# Patient Record
Sex: Male | Born: 1960
Health system: Southern US, Community
[De-identification: ages and names within clinical notes are randomized; demographics above are authoritative.]

## PROBLEM LIST (undated history)

## (undated) DIAGNOSIS — I2510 Atherosclerotic heart disease of native coronary artery without angina pectoris: Secondary | ICD-10-CM

## (undated) DIAGNOSIS — G709 Myoneural disorder, unspecified: Secondary | ICD-10-CM

## (undated) DIAGNOSIS — I219 Acute myocardial infarction, unspecified: Secondary | ICD-10-CM

## (undated) DIAGNOSIS — I10 Essential (primary) hypertension: Secondary | ICD-10-CM

## (undated) DIAGNOSIS — Q159 Congenital malformation of eye, unspecified: Secondary | ICD-10-CM

## (undated) HISTORY — DX: Acute myocardial infarction, unspecified: I21.9

## (undated) HISTORY — DX: Atherosclerotic heart disease of native coronary artery without angina pectoris: I25.10

## (undated) HISTORY — DX: Myoneural disorder, unspecified: G70.9

## (undated) HISTORY — PX: APPENDECTOMY: SHX54

## (undated) HISTORY — PX: INGUINAL HERNIA REPAIR: SUR1180

## (undated) HISTORY — DX: Essential (primary) hypertension: I10

---

## 1988-07-19 HISTORY — PX: HERNIA REPAIR: SHX51

## 2001-07-18 ENCOUNTER — Ambulatory Visit (HOSPITAL_COMMUNITY): Admission: RE | Admit: 2001-07-18 | Discharge: 2001-07-19 | Payer: Self-pay | Admitting: Ophthalmology

## 2001-07-18 ENCOUNTER — Encounter: Payer: Self-pay | Admitting: Ophthalmology

## 2001-07-19 HISTORY — PX: SCLERAL BUCKLE PROCEDURE: SHX2734

## 2001-07-19 HISTORY — PX: EYE SURGERY: SHX253

## 2001-07-19 HISTORY — PX: RETINAL DETACHMENT SURGERY: SHX105

## 2006-02-15 ENCOUNTER — Ambulatory Visit: Payer: Self-pay | Admitting: Family Medicine

## 2009-02-10 ENCOUNTER — Ambulatory Visit: Payer: Self-pay | Admitting: Family Medicine

## 2009-02-18 ENCOUNTER — Ambulatory Visit: Payer: Self-pay | Admitting: Family Medicine

## 2009-04-03 ENCOUNTER — Ambulatory Visit: Payer: Self-pay | Admitting: Family Medicine

## 2009-04-11 ENCOUNTER — Ambulatory Visit: Payer: Self-pay | Admitting: Family Medicine

## 2010-12-04 NOTE — Op Note (Signed)
[place]  Patient:    Jonathan Lindsey, Jonathan Lindsey Visit Number: [id number] MRN: [id number]          Service Type: DSU Location: [phone] Attending Physician:  Bertrum Sol Dictated by:   Beulah Gandy. Ashley Royalty, M.D. Proc. Date: 07/18/01 Admit Date:  07/18/2001                             Operative Report  DATE OF BIRTH:  Mar 01, 1961  ADMISSION DIAGNOSIS:  Rhegmatogenous retinal detachment in the right eye.  PROCEDURE:  Scleral buckle - right eye, retinal photocoagulation - right eye.  SURGEON:  Beulah Gandy. Ashley Royalty, M.D.  ASSISTANT:  Alma Downs, P.A.  ANESTHESIA:  General.  DETAILS:  Usual prep and drape.  A 360 degree limbal peritomy, isolation of four rectus muscles on 2-0 silk.  Localization of breaks in lower temporal quadrant.  Scleral dissection from 3 oclock to 10 oclock to admit a #279 intrascleral implant.  One mm was trimmed from the posterior edge.  Diathermy placed in the bed, perforation site chosen at 4 oclock in the middle aspect of the bed.  The perforation was performed and a moderate amount of clear, thick, colorless subretinal fluid came forth very slowly.  Once this was completed, the 279 implant was placed against the scleral bed and the scleral flaps were closed with 4-0 Mersilene suture.  A 508G radial segment was placed beneath the perforation point at 4 oclock.  Indirect ophthalmoscopy showed the retina to be lying nicely on the scleral buckle.  The 240 band was placed around the eye with a belt loop at 11 and 2 and a 270 sleeve at 1 oclock. The buckle ends were trimmed, the suture ends were trimmed, and the band ends were trimmed.  The indirect ophthalmoscope laser was moved into place; 900 laser burns were placed with a power of 400 mW, 1000 microns each, and 0.1 second each on the scleral buckle, as well as around previously treated retinal breaks in the upper quadrants.  The conjunctiva was reposited  with 7-0 chromic suture. Polymyxin and gentamicin were irrigated into Tenons space.  A paracentesis x 1 was performed at 1 oclock.  Closing tension was 10 with the Barraquer tonometer.  Polymyxin and gentamicin were irrigated into Tenons space, atropine solution was applied.  Decadron 10 mg was injected into the lower subconjunctival space.  Marcaine was injected around the globe for postoperative pain.  Polysporin, a patch and shield were placed.  The patient awakened and taken to recovery in satisfactory condition.  DURATION:  1-1/2 hours.  COMPLICATIONS:  None.   Closing pressure 10 mmHg. Dictated by:   Beulah Gandy. Ashley Royalty, M.D. Attending Physician:  Bertrum Sol DD:  07/18/01 TD:  07/18/01 Job: [id number]

## 2013-07-19 DIAGNOSIS — I219 Acute myocardial infarction, unspecified: Secondary | ICD-10-CM

## 2013-07-19 HISTORY — DX: Acute myocardial infarction, unspecified: I21.9

## 2013-09-13 ENCOUNTER — Encounter: Payer: Self-pay | Admitting: Family Medicine

## 2013-09-14 ENCOUNTER — Encounter: Payer: Self-pay | Admitting: Family Medicine

## 2013-09-14 ENCOUNTER — Ambulatory Visit (INDEPENDENT_AMBULATORY_CARE_PROVIDER_SITE_OTHER): Payer: BC Managed Care – PPO | Admitting: Family Medicine

## 2013-09-14 VITALS — BP 110/82 | HR 68 | Ht 71.0 in | Wt 237.0 lb

## 2013-09-14 DIAGNOSIS — Z23 Encounter for immunization: Secondary | ICD-10-CM

## 2013-09-14 DIAGNOSIS — H5702 Anisocoria: Secondary | ICD-10-CM

## 2013-09-14 DIAGNOSIS — E669 Obesity, unspecified: Secondary | ICD-10-CM

## 2013-09-14 DIAGNOSIS — K4090 Unilateral inguinal hernia, without obstruction or gangrene, not specified as recurrent: Secondary | ICD-10-CM

## 2013-09-14 DIAGNOSIS — Z0000 Encounter for general adult medical examination without abnormal findings: Secondary | ICD-10-CM

## 2013-09-14 DIAGNOSIS — J309 Allergic rhinitis, unspecified: Secondary | ICD-10-CM

## 2013-09-14 DIAGNOSIS — L723 Sebaceous cyst: Secondary | ICD-10-CM

## 2013-09-14 DIAGNOSIS — L259 Unspecified contact dermatitis, unspecified cause: Secondary | ICD-10-CM

## 2013-09-14 DIAGNOSIS — L309 Dermatitis, unspecified: Secondary | ICD-10-CM

## 2013-09-14 DIAGNOSIS — Z125 Encounter for screening for malignant neoplasm of prostate: Secondary | ICD-10-CM

## 2013-09-14 LAB — POCT URINALYSIS DIPSTICK
Bilirubin, UA: NEGATIVE
Blood, UA: NEGATIVE
Glucose, UA: NEGATIVE
Ketones, UA: NEGATIVE
Leukocytes, UA: NEGATIVE
Nitrite, UA: NEGATIVE
Protein, UA: NEGATIVE
Spec Grav, UA: 1.02
Urobilinogen, UA: NEGATIVE
pH, UA: 5

## 2013-09-14 LAB — CBC WITH DIFFERENTIAL/PLATELET
Basophils Absolute: 0.1 10*3/uL (ref 0.0–0.1)
Basophils Relative: 1 % (ref 0–1)
Eosinophils Absolute: 0.2 10*3/uL (ref 0.0–0.7)
Eosinophils Relative: 3 % (ref 0–5)
HCT: 46 % (ref 39.0–52.0)
Hemoglobin: 16.4 g/dL (ref 13.0–17.0)
Lymphocytes Relative: 38 % (ref 12–46)
Lymphs Abs: 1.9 10*3/uL (ref 0.7–4.0)
MCH: 33.1 pg (ref 26.0–34.0)
MCHC: 35.7 g/dL (ref 30.0–36.0)
MCV: 92.7 fL (ref 78.0–100.0)
Monocytes Absolute: 0.4 10*3/uL (ref 0.1–1.0)
Monocytes Relative: 8 % (ref 3–12)
Neutro Abs: 2.6 10*3/uL (ref 1.7–7.7)
Neutrophils Relative %: 50 % (ref 43–77)
Platelets: 185 10*3/uL (ref 150–400)
RBC: 4.96 MIL/uL (ref 4.22–5.81)
RDW: 13.3 % (ref 11.5–15.5)
WBC: 5.1 10*3/uL (ref 4.0–10.5)

## 2013-09-14 LAB — COMPREHENSIVE METABOLIC PANEL
ALT: 41 U/L (ref 0–53)
AST: 28 U/L (ref 0–37)
Albumin: 4.3 g/dL (ref 3.5–5.2)
Alkaline Phosphatase: 60 U/L (ref 39–117)
BUN: 19 mg/dL (ref 6–23)
CO2: 26 mEq/L (ref 19–32)
Calcium: 9.2 mg/dL (ref 8.4–10.5)
Chloride: 105 mEq/L (ref 96–112)
Creat: 0.96 mg/dL (ref 0.50–1.35)
Glucose, Bld: 99 mg/dL (ref 70–99)
Potassium: 4 mEq/L (ref 3.5–5.3)
Sodium: 138 mEq/L (ref 135–145)
Total Bilirubin: 0.7 mg/dL (ref 0.2–1.2)
Total Protein: 6.7 g/dL (ref 6.0–8.3)

## 2013-09-14 LAB — LIPID PANEL
Cholesterol: 195 mg/dL (ref 0–200)
HDL: 31 mg/dL — ABNORMAL LOW (ref >39)
LDL Cholesterol: 128 mg/dL — ABNORMAL HIGH (ref 0–99)
Total CHOL/HDL Ratio: 6.3 Ratio
Triglycerides: 180 mg/dL — ABNORMAL HIGH (ref <150)
VLDL: 36 mg/dL (ref 0–40)

## 2013-09-14 NOTE — Addendum Note (Signed)
Addended by: Denita Lung on: 09/14/2013 01:40 PM   Modules accepted: Level of Service

## 2013-09-14 NOTE — Patient Instructions (Signed)
Use cortisone cream on the dry areas sparingly

## 2013-09-14 NOTE — Progress Notes (Signed)
Subjective:    Patient ID: Jonathan Lindsey, male    DOB: 01-11-1961, 53 y.o.   MRN: [id number]  HPI He is here for complete examination. He has not been seen in several years. He has had previous eye surgery in the left limbs was removed causing an change in pupillary size. He also complains of swelling in the left inguinal area. At this point it is giving him very little difficulty. He does have underlying allergies and has very low difficulty with them. He also complains of dryness to the medial aspect of both elbows and mainly has this during the winter months. He also has a lesion present on his forehead. This was removed several years ago and has recurred. Family and social history were reviewed. He does have a 86 year old brother who apparently has had difficulty with congestive heart failure. His marriage is going well. Work is going well.   Review of Systems  All other systems reviewed and are negative.       Objective:   Physical Exam BP 110/82  Pulse 68  Ht 5\' 11"  (1.803 m)  Wt 237 lb (107.502 kg)  BMI 33.07 kg/m2  General Appearance:    Alert, cooperative, no distress, appears stated age  Head:    Normocephalic, without obvious abnormality, atraumatic.2 cm round smooth movable lesions noted on the forehead   Eyes:    the left pupil is dilated more than the right, conjunctiva/corneas clear, EOM's intact, fundi    benign  Ears:    Normal TM's and external ear canals  Nose:   Nares normal, mucosa normal, no drainage or sinus   tenderness  Throat:   Lips, mucosa, and tongue normal; teeth and gums normal  Neck:   Supple, no lymphadenopathy;  thyroid:  no   enlargement/tenderness/nodules; no carotid   bruit or JVD  Back:    Spine nontender, no curvature, ROM normal, no CVA     tenderness  Lungs:     Clear to auscultation bilaterally without wheezes, rales or     ronchi; respirations unlabored  Chest Wall:    No tenderness or deformity   Heart:    Regular rate and rhythm,  S1 and S2 normal, no murmur, rub   or gallop  Breast Exam:    No chest wall tenderness, masses or gynecomastia  Abdomen:     Soft, non-tender, nondistended, normoactive bowel sounds,    no masses, no hepatosplenomegaly  Genitalia:    Normal male external genitalia without lesions.  Testicles without masses.  Left inguinal hernia is noted.  Rectal:   deferred   Extremities:   No clubbing, cyanosis or edema  Pulses:   2+ and symmetric all extremities  Skin:   Skin color, texture, turgor normal, too dryish patches are noted on the medial aspects of both elbows   Lymph nodes:   Cervical, supraclavicular, and axillary nodes normal  Neurologic:   CNII-XII intact, normal strength, sensation and gait; reflexes 2+ and symmetric throughout          Psych:   Normal mood, affect, hygiene and grooming.   EKG shows no acute changes       Assessment & Plan:  Routine general medical examination at a health care facility - Plan: Ambulatory referral to Gastroenterology, CBC with Differential, Comprehensive metabolic panel, Lipid panel, Tdap vaccine greater than or equal to 7yo IM, PSA, EKG 12-Lead, Urinalysis Dipstick  Anisocoria  Left inguinal hernia - Plan: Ambulatory referral to General Surgery  Allergic rhinitis due to allergen  Obesity (BMI 30-39.9)  Special screening for malignant neoplasm of prostate - Plan: PSA  Dermatitis  Sebaceous cyst  pupillary changes due to the surgery. He will be referred for Gen. surgery for the hernia. Recommend cortisone cream for his skin and return here for cyst excision at this convenience.

## 2013-09-15 LAB — PSA: PSA: 1.82 ng/mL (ref <=4.00)

## 2013-09-26 ENCOUNTER — Encounter: Payer: Self-pay | Admitting: Internal Medicine

## 2013-10-03 ENCOUNTER — Ambulatory Visit (INDEPENDENT_AMBULATORY_CARE_PROVIDER_SITE_OTHER): Payer: PRIVATE HEALTH INSURANCE | Admitting: Surgery

## 2013-10-03 ENCOUNTER — Encounter (INDEPENDENT_AMBULATORY_CARE_PROVIDER_SITE_OTHER): Payer: Self-pay | Admitting: Surgery

## 2013-10-03 VITALS — BP 146/90 | HR 84 | Temp 98.6°F | Resp 14 | Ht 71.0 in | Wt 239.4 lb

## 2013-10-03 DIAGNOSIS — K4090 Unilateral inguinal hernia, without obstruction or gangrene, not specified as recurrent: Secondary | ICD-10-CM | POA: Insufficient documentation

## 2013-10-03 NOTE — Patient Instructions (Signed)
[facility]  HERNIA REPAIR POST OP INSTRUCTIONS  Always review your discharge instruction sheet given to you by the facility where your surgery was performed.  1. A  prescription for pain medication may be given to you upon discharge.  Take your pain medication as prescribed.  If narcotic pain medicine is not needed, then you may take acetaminophen (Tylenol) or ibuprofen (Advil) as needed.  2. Take your usually prescribed medications unless otherwise directed.  3. If you need a refill on your pain medication, please contact your pharmacy.  They will contact our office to request authorization. Prescriptions will not be filled after 5 pm daily or on weekends.  4. You should follow a light diet the first 24 hours after arrival home, such as soup and crackers or toast.  Be sure to include plenty of fluids daily.  Resume your normal diet the day after surgery.  5. Most patients will experience some swelling and bruising around the surgical site.  Ice packs and reclining will help.  Swelling and bruising can take several days to resolve.   6. It is common to experience some constipation if taking pain medication after surgery.  Increasing fluid intake and taking a stool softener (such as Colace) will usually help or prevent this problem from occurring.  A mild laxative (Milk of Magnesia or Miralax) should be taken according to package directions if there are no bowel movements after 48 hours.  7. Unless discharge instructions indicate otherwise, you may remove your bandages 24-48 hours after surgery, and you may shower at that time.  You may have steri-strips (small skin tapes) in place directly over the incision.  These strips should be left on the skin for 7-10 days.  If your surgeon used skin glue on the incision, you may shower in 24 hours.  The glue will flake off over the next 2-3 weeks.  Any sutures or staples will be removed at the office during your follow-up  visit.  8. ACTIVITIES:  You may resume regular (light) daily activities beginning the next day-such as daily self-care, walking, climbing stairs-gradually increasing activities as tolerated.  You may have sexual intercourse when it is comfortable.  Refrain from any heavy lifting or straining until approved by your doctor.  You may drive when you are no longer taking prescription pain medication, you can comfortably wear a seatbelt, and you can safely maneuver your car and apply brakes.  9. You should see your doctor in the office for a follow-up appointment approximately 2-3 weeks after your surgery.  Make sure that you call for this appointment within a day or two after you arrive home to insure a convenient appointment time. 10.   WHEN TO CALL YOUR DOCTOR: 1. Fever greater than 101.0 2. Inability to urinate 3. Persistent nausea and/or vomiting 4. Extreme swelling or bruising 5. Continued bleeding from incision 6. Increased pain, redness, or drainage from the incision  The clinic staff is available to answer your questions during regular business hours.  Please don't hesitate to call and ask to speak to one of the nurses for clinical concerns.  If you have a medical emergency, go to the nearest emergency room or call 911.  A surgeon from [facility] is always on call for the hospital.   [facility] [address], [address], [place], [place]  [address]  [phone] ? [phone] ? FAX [phone]  [email or web address]   

## 2013-10-03 NOTE — Progress Notes (Signed)
General Surgery [facility]  Chief Complaint  Patient presents with  . New Evaluation    eval [facility] - referral from Dr. Jill Alexanders    HISTORY: Patient is a 53 year old male referred by his primary care physician for evaluation of left inguinal hernia. Patient states that the hernia has been present for approximately one year. He has gradually increased in size. It causes intermittent discomfort. He denies any signs or symptoms of intestinal obstruction. It has always been reducible.  Patient had undergone previous right inguinal hernia in 1990. This was repaired with mesh. Patient also has a distant history of appendectomy.  History reviewed. No pertinent past medical history.  No current outpatient prescriptions on file.   No current facility-administered medications for this visit.    Not on File  Family History  Problem Relation Age of Onset  . Diabetes Mother   . Hyperlipidemia Mother   . Hypertension Mother   . Cancer Father     MULTIPLE MELANOMAS    History   Social History  . Marital Status: Single    Spouse Name: N/A    Number of Children: N/A  . Years of Education: N/A   Social History Main Topics  . Smoking status: Never Smoker   . Smokeless tobacco: Never Used  . Alcohol Use: Yes     Comment: rare  . Drug Use: No  . Sexual Activity: Yes   Other Topics Concern  . None   Social History Narrative  . None    REVIEW OF SYSTEMS - PERTINENT POSITIVES ONLY: Denies signs or symptoms of obstruction. Intermittent discomfort. Always reducible.  EXAM: Filed Vitals:   10/03/13 1002  BP: 146/90  Pulse: 84  Temp: 98.6 F (37 C)  Resp: 14    GENERAL: well-developed, well-nourished, no acute distress HEENT: normocephalic; pupils equal and reactive; sclerae clear; dentition good; mucous membranes moist NECK:  No palpable masses in the thyroid bed symmetric on extension; no palpable anterior or posterior cervical lymphadenopathy; no  supraclavicular masses; no tenderness CHEST: clear to auscultation bilaterally without rales, rhonchi, or wheezes CARDIAC: regular rate and rhythm without significant murmur; peripheral pulses are full ABDOMEN: soft without distension; bowel sounds present; no mass; no hepatosplenomegaly; small umbilical hernia likely containing incarcerated omentum, nontender GU:  Normal male genitalia without mass or lesion; right groin with well-healed incision; palpation in the right inguinal canal with cough and Valsalva shows no sign of hernia; obvious bulge in left groin; palpation reveals a left inguinal hernia which is minimally tender and is reducible; left hernia augments with cough and Valsalva EXT:  non-tender without edema; no deformity NEURO: no gross focal deficits; no sign of tremor   LABORATORY RESULTS: See [facility] (CHL-Epic) for most recent results  RADIOLOGY RESULTS: See [facility] (CHL-Epic) for most recent results  IMPRESSION: #1 left inguinal hernia, reducible, mildly symptomatic #2 umbilical hernia, clinically stable, asymptomatic #3 history of right inguinal hernia status post repair without evidence of recurrence  PLAN: I discussed the above findings with the patient. I provided him with written literature to review at home. We discussed left inguinal hernia repair with mesh. We discussed the risk and benefits of the procedure including the possibility of recurrence. We discussed restrictions on his activities following the procedure. He understands and would like to proceed with surgery as an outpatient in April 2015. We will make arrangements for his procedure.  The risks and benefits of the procedure have been discussed at length with  the patient.  The patient understands the proposed procedure, potential alternative treatments, and the course of recovery to be expected.  All of the patient's questions have been answered at this time.  The patient wishes to proceed  with surgery.  Earnstine Regal, MD, [facility]  Primary Care Physician: Wyatt Haste, MD

## 2013-11-02 DIAGNOSIS — K4090 Unilateral inguinal hernia, without obstruction or gangrene, not specified as recurrent: Secondary | ICD-10-CM

## 2013-11-05 ENCOUNTER — Encounter (HOSPITAL_COMMUNITY): Payer: Self-pay | Admitting: Emergency Medicine

## 2013-11-05 ENCOUNTER — Encounter (HOSPITAL_COMMUNITY)
Admission: EM | Disposition: A | Discharge status: Home / Self Care | Payer: BC Managed Care – PPO | Source: Home / Self Care | Attending: Cardiology

## 2013-11-05 ENCOUNTER — Emergency Department (HOSPITAL_COMMUNITY): Payer: BC Managed Care – PPO

## 2013-11-05 ENCOUNTER — Inpatient Hospital Stay (HOSPITAL_COMMUNITY)
Admission: EM | Admit: 2013-11-05 | Discharge: 2013-11-08 | DRG: 247 | Disposition: A | Discharge status: Home / Self Care | Payer: BC Managed Care – PPO | Attending: Cardiology | Admitting: Cardiology

## 2013-11-05 DIAGNOSIS — F17200 Nicotine dependence, unspecified, uncomplicated: Secondary | ICD-10-CM | POA: Diagnosis present

## 2013-11-05 DIAGNOSIS — Z833 Family history of diabetes mellitus: Secondary | ICD-10-CM

## 2013-11-05 DIAGNOSIS — I2109 ST elevation (STEMI) myocardial infarction involving other coronary artery of anterior wall: Principal | ICD-10-CM | POA: Diagnosis present

## 2013-11-05 DIAGNOSIS — Z6831 Body mass index (BMI) 31.0-31.9, adult: Secondary | ICD-10-CM

## 2013-11-05 DIAGNOSIS — R7309 Other abnormal glucose: Secondary | ICD-10-CM | POA: Diagnosis present

## 2013-11-05 DIAGNOSIS — Z7982 Long term (current) use of aspirin: Secondary | ICD-10-CM

## 2013-11-05 DIAGNOSIS — E7800 Pure hypercholesterolemia, unspecified: Secondary | ICD-10-CM | POA: Diagnosis present

## 2013-11-05 DIAGNOSIS — I2510 Atherosclerotic heart disease of native coronary artery without angina pectoris: Secondary | ICD-10-CM | POA: Diagnosis present

## 2013-11-05 DIAGNOSIS — I213 ST elevation (STEMI) myocardial infarction of unspecified site: Secondary | ICD-10-CM

## 2013-11-05 DIAGNOSIS — Z9889 Other specified postprocedural states: Secondary | ICD-10-CM

## 2013-11-05 DIAGNOSIS — Z8249 Family history of ischemic heart disease and other diseases of the circulatory system: Secondary | ICD-10-CM

## 2013-11-05 DIAGNOSIS — I2582 Chronic total occlusion of coronary artery: Secondary | ICD-10-CM | POA: Diagnosis present

## 2013-11-05 HISTORY — PX: LEFT HEART CATHETERIZATION WITH CORONARY ANGIOGRAM: SHX5451

## 2013-11-05 LAB — BASIC METABOLIC PANEL
BUN: 13 mg/dL (ref 6–23)
CHLORIDE: 101 meq/L (ref 96–112)
CO2: 23 mEq/L (ref 19–32)
Calcium: 8.9 mg/dL (ref 8.4–10.5)
Creatinine, Ser: 0.93 mg/dL (ref 0.50–1.35)
GFR calc Af Amer: >90 mL/min (ref >90)
GFR calc non Af Amer: >90 mL/min (ref >90)
GLUCOSE: 144 mg/dL — AB (ref 70–99)
Potassium: 3.9 mEq/L (ref 3.7–5.3)
Sodium: 140 mEq/L (ref 137–147)

## 2013-11-05 LAB — CBC
HCT: 47.4 % (ref 39.0–52.0)
HEMOGLOBIN: 17.2 g/dL — AB (ref 13.0–17.0)
MCH: 33.5 pg (ref 26.0–34.0)
MCHC: 36.3 g/dL — AB (ref 30.0–36.0)
MCV: 92.2 fL (ref 78.0–100.0)
Platelets: 195 10*3/uL (ref 150–400)
RBC: 5.14 MIL/uL (ref 4.22–5.81)
RDW: 11.7 % (ref 11.5–15.5)
WBC: 8.6 10*3/uL (ref 4.0–10.5)

## 2013-11-05 LAB — I-STAT CHEM 8, ED
BUN: 12 mg/dL (ref 6–23)
CHLORIDE: 101 meq/L (ref 96–112)
Calcium, Ion: 1.07 mmol/L — ABNORMAL LOW (ref 1.12–1.23)
Creatinine, Ser: 1 mg/dL (ref 0.50–1.35)
GLUCOSE: 152 mg/dL — AB (ref 70–99)
HCT: 49 % (ref 39.0–52.0)
Hemoglobin: 16.7 g/dL (ref 13.0–17.0)
Potassium: 3.5 mEq/L — ABNORMAL LOW (ref 3.7–5.3)
Sodium: 139 mEq/L (ref 137–147)
TCO2: 24 mmol/L (ref 0–100)

## 2013-11-05 LAB — TROPONIN I: Troponin I: 1.62 ng/mL (ref <0.30)

## 2013-11-05 SURGERY — LEFT HEART CATHETERIZATION WITH CORONARY ANGIOGRAM
Anesthesia: LOCAL

## 2013-11-05 MED ORDER — DOCUSATE SODIUM 100 MG PO CAPS
100.0000 mg | ORAL_CAPSULE | Freq: Two times a day (BID) | ORAL | Status: DC
  Administered 2013-11-06 – 2013-11-08 (×6): 100 mg via ORAL
  Filled 2013-11-05 (×8): qty 1

## 2013-11-05 MED ORDER — SODIUM CHLORIDE 0.9 % IV SOLN
INTRAVENOUS | Status: AC
  Administered 2013-11-05 – 2013-11-06 (×2): via INTRAVENOUS

## 2013-11-05 MED ORDER — NITROGLYCERIN IN D5W 200-5 MCG/ML-% IV SOLN
5.0000 ug/min | INTRAVENOUS | Status: DC
  Administered 2013-11-05: 5 ug/min via INTRAVENOUS

## 2013-11-05 MED ORDER — TICAGRELOR 90 MG PO TABS
90.0000 mg | ORAL_TABLET | Freq: Two times a day (BID) | ORAL | Status: DC
  Administered 2013-11-06 – 2013-11-08 (×5): 90 mg via ORAL
  Filled 2013-11-05 (×7): qty 1

## 2013-11-05 MED ORDER — ATORVASTATIN CALCIUM 80 MG PO TABS
80.0000 mg | ORAL_TABLET | Freq: Every day | ORAL | Status: DC
  Administered 2013-11-06 – 2013-11-07 (×3): 80 mg via ORAL
  Filled 2013-11-05 (×4): qty 1

## 2013-11-05 MED ORDER — PANTOPRAZOLE SODIUM 40 MG PO TBEC
40.0000 mg | DELAYED_RELEASE_TABLET | Freq: Every day | ORAL | Status: DC
  Administered 2013-11-06 – 2013-11-08 (×3): 40 mg via ORAL
  Filled 2013-11-05 (×3): qty 1

## 2013-11-05 MED ORDER — ASPIRIN EC 81 MG PO TBEC: 81.0000 mg | DELAYED_RELEASE_TABLET | Freq: Every day | ORAL | Status: DC

## 2013-11-05 MED ORDER — HYDROCODONE-ACETAMINOPHEN 5-325 MG PO TABS
1.0000 | ORAL_TABLET | Freq: Four times a day (QID) | ORAL | Status: DC | PRN
  Administered 2013-11-06 (×2): 1 via ORAL
  Filled 2013-11-05: qty 2
  Filled 2013-11-05 (×2): qty 1

## 2013-11-05 MED ORDER — INSULIN ASPART 100 UNIT/ML ~~LOC~~ SOLN
0.0000 [IU] | Freq: Three times a day (TID) | SUBCUTANEOUS | Status: DC
  Administered 2013-11-06: 1 [IU] via SUBCUTANEOUS

## 2013-11-05 MED ORDER — ASPIRIN 81 MG PO CHEW
81.0000 mg | CHEWABLE_TABLET | Freq: Every day | ORAL | Status: DC
  Administered 2013-11-06 – 2013-11-08 (×3): 81 mg via ORAL
  Filled 2013-11-05 (×3): qty 1

## 2013-11-05 MED ORDER — ASPIRIN 81 MG PO CHEW: 324.0000 mg | CHEWABLE_TABLET | ORAL | Status: DC

## 2013-11-05 MED ORDER — HEPARIN SODIUM (PORCINE) 5000 UNIT/ML IJ SOLN
4000.0000 [IU] | Freq: Once | INTRAMUSCULAR | Status: AC
  Administered 2013-11-05: 4000 [IU] via INTRAVENOUS

## 2013-11-05 MED ORDER — ACETAMINOPHEN 325 MG PO TABS
650.0000 mg | ORAL_TABLET | Freq: Four times a day (QID) | ORAL | Status: DC | PRN
  Administered 2013-11-06 – 2013-11-07 (×2): 650 mg via ORAL
  Filled 2013-11-05 (×2): qty 2

## 2013-11-05 MED ORDER — MIDAZOLAM HCL 2 MG/2ML IJ SOLN
INTRAMUSCULAR | Status: AC
  Filled 2013-11-05: qty 2

## 2013-11-05 MED ORDER — RAMIPRIL 2.5 MG PO CAPS
2.5000 mg | ORAL_CAPSULE | Freq: Every day | ORAL | Status: DC
  Administered 2013-11-06 – 2013-11-08 (×3): 2.5 mg via ORAL
  Filled 2013-11-05 (×3): qty 1

## 2013-11-05 MED ORDER — TICAGRELOR 90 MG PO TABS: 90.0000 mg | ORAL_TABLET | Freq: Two times a day (BID) | ORAL | Status: DC

## 2013-11-05 MED ORDER — TICAGRELOR 90 MG PO TABS
180.0000 mg | ORAL_TABLET | Freq: Once | ORAL | Status: AC
  Administered 2013-11-05: 180 mg via ORAL
  Filled 2013-11-05: qty 2

## 2013-11-05 MED ORDER — NITROGLYCERIN 0.4 MG SL SUBL: 0.4000 mg | SUBLINGUAL_TABLET | SUBLINGUAL | Status: DC | PRN

## 2013-11-05 MED ORDER — HEPARIN (PORCINE) IN NACL 2-0.9 UNIT/ML-% IJ SOLN
INTRAMUSCULAR | Status: AC
  Filled 2013-11-05: qty 1000

## 2013-11-05 MED ORDER — CARVEDILOL 3.125 MG PO TABS
3.1250 mg | ORAL_TABLET | Freq: Two times a day (BID) | ORAL | Status: DC
  Administered 2013-11-06: 3.125 mg via ORAL
  Filled 2013-11-05 (×3): qty 1

## 2013-11-05 MED ORDER — FENTANYL CITRATE 0.05 MG/ML IJ SOLN
INTRAMUSCULAR | Status: AC
  Filled 2013-11-05: qty 2

## 2013-11-05 MED ORDER — LIDOCAINE HCL (PF) 1 % IJ SOLN
INTRAMUSCULAR | Status: AC
  Filled 2013-11-05: qty 30

## 2013-11-05 MED ORDER — ASPIRIN 300 MG RE SUPP
300.0000 mg | RECTAL | Status: DC
  Filled 2013-11-05: qty 1

## 2013-11-05 MED ORDER — NITROGLYCERIN IN D5W 200-5 MCG/ML-% IV SOLN
INTRAVENOUS | Status: AC
  Filled 2013-11-05: qty 250

## 2013-11-05 MED ORDER — NITROGLYCERIN 0.2 MG/ML ON CALL CATH LAB
INTRAVENOUS | Status: AC
  Filled 2013-11-05: qty 1

## 2013-11-05 MED ORDER — BIVALIRUDIN 250 MG IV SOLR
INTRAVENOUS | Status: AC
  Filled 2013-11-05: qty 250

## 2013-11-05 NOTE — ED Notes (Addendum)
Dr Essie Christine at bedside

## 2013-11-05 NOTE — ED Provider Notes (Signed)
CSN: [id number]     Arrival date & time 11/05/13  2034 History   First MD Initiated Contact with Patient 11/05/13 2101     Chief Complaint  Patient presents with  . Code STEMI     HPI Pt reports worsening intermittent CP through the weekend since inguinal hernia surgery 3 days ago. No hx of heart disease. No family hx of heart disease. Pain this evening worsened and moved down his left arm with SOB, diaphoresis and became pale per family. No abdominal pain. Asa and nitro prior to arrival. Nitro resulted in some relief in his pain. Pain is mild at this time   History reviewed. No pertinent past medical history. Past Surgical History  Procedure Laterality Date  . Hernia repair  1990    NOT SURE ??rih WITH MESH  . Appendectomy    . Eye surgery  2003    EYE DETACHMENT RETINA AND BUCKLES   Family History  Problem Relation Age of Onset  . Diabetes Mother   . Hyperlipidemia Mother   . Hypertension Mother   . Cancer Father     MULTIPLE MELANOMAS   History  Substance Use Topics  . Smoking status: Never Smoker   . Smokeless tobacco: Never Used  . Alcohol Use: Yes     Comment: rare    Review of Systems  All other systems reviewed and are negative.     Allergies  Review of patient's allergies indicates no known allergies.  Home Medications   Prior to Admission medications   Not on File   BP 139/95  Pulse 97  Temp(Src) 98.7 F (37.1 C) (Oral)  Resp 12  Ht 5\' 11"  (1.803 m)  Wt 226 lb (102.513 kg)  BMI 31.53 kg/m2  SpO2 94% Physical Exam  Nursing note and vitals reviewed. Constitutional: He is oriented to person, place, and time. He appears well-developed and well-nourished.  HENT:  Head: Normocephalic and atraumatic.  Eyes: EOM are normal.  Neck: Normal range of motion.  Cardiovascular: Normal rate, regular rhythm, normal heart sounds and intact distal pulses.   Pulmonary/Chest: Effort normal and breath sounds normal. No respiratory distress.  Abdominal: Soft.  He exhibits no distension. There is no tenderness.  Musculoskeletal: Normal range of motion.  Neurological: He is alert and oriented to person, place, and time.  Skin: Skin is warm and dry.  Psychiatric: He has a normal mood and affect. Judgment normal.    ED Course  Procedures (including critical care time) CRITICAL CARE Performed by: Hoy Morn Total critical care time: 30 Critical care time was exclusive of separately billable procedures and treating other patients. Critical care was necessary to treat or prevent imminent or life-threatening deterioration. Critical care was time spent personally by me on the following activities: development of treatment plan with patient and/or surrogate as well as nursing, discussions with consultants, evaluation of patient's response to treatment, examination of patient, obtaining history from patient or surrogate, ordering and performing treatments and interventions, ordering and review of laboratory studies, ordering and review of radiographic studies, pulse oximetry and re-evaluation of patient's condition.   Labs Review Labs Reviewed  I-STAT CHEM 8, ED - Abnormal; Notable for the following:    Potassium 3.5 (*)    Glucose, Bld 152 (*)    Calcium, Ion 1.07 (*)    All other components within normal limits  CBC  BASIC METABOLIC PANEL  TROPONIN I  I-STAT TROPOININ, ED    Imaging Review No results found.  ECG  interpretation 2045  Date: 11/05/2013  Rate: 77  Rhythm: normal sinus rhythm  QRS Axis: normal  Intervals: normal  ST/T Wave abnormalities: concerning for ST elevation in anterior leads  Conduction Disutrbances: none  Narrative Interpretation:   Old EKG Reviewed: changed from prior ecg, concerning for STEMI     MDM   Final diagnoses:  STEMI (ST elevation myocardial infarction)    Concerning for recent intraoperative plaque rupture now with ACS and ST elevation on ECG as compared to prior ecg. ASA and nitro given PTA.  Heparin bolus in ER. Spoke with Dr Terrence Dupont, STEMI MD who requests brilinta as well. Still with CP at this time. zoll monitor. Will await cath team     Hoy Morn, MD 11/05/13 2109

## 2013-11-05 NOTE — ED Notes (Signed)
Pt arrives via EMS. Hernia repair sx on Friday. Friday afternoon pt started experiencing substernal discomfort. Pt then started taking pain medication, hydrocodone, with relief. Pain seems to present after eating, and then pt would take hydrocodone. Pt states that tonight the pain did not get better with pain medication and it is the worst it has been. Initial 12 lead, V3 elevation. After another EKG lead 3 and V2 now showing elevation.  BP 154/102 HR 97 94% 2L. 4 ASA, 2 NTG with no relief of chest pain. Pt also c/o nausea.

## 2013-11-05 NOTE — H&P (Signed)
Jonathan Lindsey is an 53 y.o. male.   Chief Complaint: Recurrent chest pain HPI: Patient is 53 year old male with no significant past medical history came to the ER by EMS complaining of recurrent retrosternal chest pain described as pressure heaviness as if someone is standing on his chest off and on since this left internal hernia surgery for last 3 days. Today around 6:30 PM pain got worst after eating supper grade 10 over 10 radiating to left arm associated with mild shortness of breath diaphoresis and pale appearance noted by family. Patient denies such episodes of chest pain in the past EKG done in the ER showed normal sinus rhythm with the minimal ST elevation in lead V1 to V3 and very minimal reciprocal changes in lead 1 and aVL suggestive of acute anteroseptal injury.Code STEMI was called and patient was taken emergently to Cath Lab  History reviewed. No pertinent past medical history.  Past Surgical History  Procedure Laterality Date  . Hernia repair  1990    NOT SURE ??rih WITH MESH  . Appendectomy    . Eye surgery  2003    EYE DETACHMENT RETINA AND BUCKLES    Family History  Problem Relation Age of Onset  . Diabetes Mother   . Hyperlipidemia Mother   . Hypertension Mother   . Cancer Father     MULTIPLE MELANOMAS   Social History:  reports that he has never smoked. He has never used smokeless tobacco. He reports that he drinks alcohol. He reports that he does not use illicit drugs.  Allergies: No Known Allergies  Medications Prior to Admission  Medication Sig Dispense Refill  . aspirin 81 MG tablet Take 324 mg by mouth daily.      Marland Kitchen docusate sodium (COLACE) 100 MG capsule Take 100 mg by mouth 2 (two) times daily.      Marland Kitchen HYDROcodone-acetaminophen (NORCO/VICODIN) 5-325 MG per tablet Take 1 tablet by mouth every 6 (six) hours as needed for moderate pain.      . Multiple Vitamins-Minerals (EYE VITAMINS PO) Take 1 tablet by mouth daily.      . Omega-3 Fatty Acids (FISH OIL)  1000 MG CAPS Take 1 capsule by mouth daily.        Results for orders placed during the hospital encounter of 11/05/13 (from the past 48 hour(s))  CBC     Status: Abnormal   Collection Time    11/05/13  8:43 PM      Result Value Ref Range   WBC 8.6  4.0 - 10.5 K/uL   RBC 5.14  4.22 - 5.81 MIL/uL   Hemoglobin 17.2 (*) 13.0 - 17.0 g/dL   HCT 47.4  39.0 - 52.0 %   MCV 92.2  78.0 - 100.0 fL   MCH 33.5  26.0 - 34.0 pg   MCHC 36.3 (*) 30.0 - 36.0 g/dL   RDW 11.7  11.5 - 15.5 %   Platelets 195  150 - 400 K/uL  BASIC METABOLIC PANEL     Status: Abnormal   Collection Time    11/05/13  8:43 PM      Result Value Ref Range   Sodium 140  137 - 147 mEq/L   Potassium 3.9  3.7 - 5.3 mEq/L   Chloride 101  96 - 112 mEq/L   CO2 23  19 - 32 mEq/L   Glucose, Bld 144 (*) 70 - 99 mg/dL   BUN 13  6 - 23 mg/dL   Creatinine, Ser 0.93  0.50 - 1.35 mg/dL   Calcium 8.9  8.4 - 10.5 mg/dL   GFR calc non Af Amer >90  >90 mL/min   GFR calc Af Amer >90  >90 mL/min   Comment: (NOTE)     The eGFR has been calculated using the CKD EPI equation.     This calculation has not been validated in all clinical situations.     eGFR's persistently <90 mL/min signify possible Chronic Kidney     Disease.  TROPONIN I     Status: Abnormal   Collection Time    11/05/13  8:57 PM      Result Value Ref Range   Troponin I 1.62 (*) <0.30 ng/mL   Comment:            Due to the release kinetics of cTnI,     a negative result within the first hours     of the onset of symptoms does not rule out     myocardial infarction with certainty.     If myocardial infarction is still suspected,     repeat the test at appropriate intervals.     CRITICAL RESULT CALLED TO, READ BACK BY AND VERIFIED WITHUnknown Jim RN [phone] 2152 GREEN R  I-STAT CHEM 8, ED     Status: Abnormal   Collection Time    11/05/13  9:02 PM      Result Value Ref Range   Sodium 139  137 - 147 mEq/L   Potassium 3.5 (*) 3.7 - 5.3 mEq/L   Chloride 101  96 -  112 mEq/L   BUN 12  6 - 23 mg/dL   Creatinine, Ser 1.00  0.50 - 1.35 mg/dL   Glucose, Bld 152 (*) 70 - 99 mg/dL   Calcium, Ion 1.07 (*) 1.12 - 1.23 mmol/L   TCO2 24  0 - 100 mmol/L   Hemoglobin 16.7  13.0 - 17.0 g/dL   HCT 49.0  39.0 - 52.0 %   Dg Chest Port 1 View  11/05/2013   CLINICAL DATA:  Chest pain.  Decreased heart rate.  Sweats.  EXAM: PORTABLE CHEST - 1 VIEW  COMPARISON:  None.  FINDINGS: Shallow inspiration. The heart size and mediastinal contours are within normal limits. Both lungs are clear. The visualized skeletal structures are unremarkable.  IMPRESSION: No active disease.   Electronically Signed   By: Lucienne Capers M.D.   On: 11/05/2013 21:28    Review of Systems  Constitutional: Negative for chills.  HENT: Negative for hearing loss and tinnitus.   Eyes: Negative for double vision, photophobia and pain.  Respiratory: Positive for shortness of breath. Negative for cough, hemoptysis and sputum production.   Cardiovascular: Negative for palpitations, orthopnea, claudication and leg swelling.  Gastrointestinal: Positive for nausea. Negative for vomiting and abdominal pain.  Genitourinary: Negative for dysuria.  Neurological: Negative for dizziness and headaches.    Blood pressure 145/97, pulse 82, temperature 98.7 F (37.1 C), temperature source Oral, resp. rate 10, height '5\' 11"'  (1.803 m), weight 102.513 kg (226 lb), SpO2 96.00%. Physical Exam  Constitutional: He is oriented to person, place, and time.  HENT:  Head: Atraumatic.  Eyes: Conjunctivae are normal. Pupils are equal, round, and reactive to light. Left eye exhibits no discharge. No scleral icterus.  Neck: Normal range of motion. Neck supple. No JVD present. No tracheal deviation present. No thyromegaly present.  Cardiovascular: Normal rate and regular rhythm.   Murmur (soft systolic murmur and S4  gallop noted) heard. Respiratory: Effort normal and breath sounds normal. No respiratory distress. He has no  wheezes. He has no rales.  GI: Soft. Bowel sounds are normal. He exhibits no distension. There is no tenderness. There is no rebound and no guarding.  Left internal surgical area dry  Musculoskeletal: He exhibits no edema and no tenderness.  Neurological: He is alert and oriented to person, place, and time.     Assessment/Plan Acute anteroseptal wall myocardial infarction Elevated blood sugar rule out diabetes mellitus Status post recent left inguinal hernia repair   plan As per orders  Discussed with patient and his wife briefly regarding emergency left cath possible PTCA stenting its risk and benefits i.e. death MI stroke need for emergency CABG local basilar complications etc. and consented for PCI  Clent Demark 11/05/2013, 10:26 PM

## 2013-11-05 NOTE — CV Procedure (Signed)
Left cardiac cath/PTCA stenting report dictated on 11/05/2013 dictation number is [id number]

## 2013-11-05 NOTE — ED Notes (Signed)
Dr. Campos at bedside   

## 2013-11-06 ENCOUNTER — Telehealth (INDEPENDENT_AMBULATORY_CARE_PROVIDER_SITE_OTHER): Payer: Self-pay

## 2013-11-06 LAB — COMPREHENSIVE METABOLIC PANEL
ALBUMIN: 3.2 g/dL — AB (ref 3.5–5.2)
ALT: 39 U/L (ref 0–53)
AST: 84 U/L — AB (ref 0–37)
Alkaline Phosphatase: 67 U/L (ref 39–117)
BUN: 13 mg/dL (ref 6–23)
CALCIUM: 9.4 mg/dL (ref 8.4–10.5)
CO2: 24 meq/L (ref 19–32)
CREATININE: 0.88 mg/dL (ref 0.50–1.35)
Chloride: 102 mEq/L (ref 96–112)
GFR calc Af Amer: >90 mL/min (ref >90)
Glucose, Bld: 132 mg/dL — ABNORMAL HIGH (ref 70–99)
Potassium: 4.1 mEq/L (ref 3.7–5.3)
Sodium: 139 mEq/L (ref 137–147)
Total Bilirubin: 0.4 mg/dL (ref 0.3–1.2)
Total Protein: 6.4 g/dL (ref 6.0–8.3)

## 2013-11-06 LAB — CBC
HCT: 44.9 % (ref 39.0–52.0)
Hemoglobin: 16.3 g/dL (ref 13.0–17.0)
MCH: 33.5 pg (ref 26.0–34.0)
MCHC: 36.3 g/dL — AB (ref 30.0–36.0)
MCV: 92.2 fL (ref 78.0–100.0)
PLATELETS: 172 10*3/uL (ref 150–400)
RBC: 4.87 MIL/uL (ref 4.22–5.81)
RDW: 11.8 % (ref 11.5–15.5)
WBC: 7.7 10*3/uL (ref 4.0–10.5)

## 2013-11-06 LAB — CBC WITH DIFFERENTIAL/PLATELET
BASOS ABS: 0 10*3/uL (ref 0.0–0.1)
Basophils Relative: 0 % (ref 0–1)
EOS ABS: 0.1 10*3/uL (ref 0.0–0.7)
EOS PCT: 2 % (ref 0–5)
HCT: 44.2 % (ref 39.0–52.0)
Hemoglobin: 16.1 g/dL (ref 13.0–17.0)
Lymphocytes Relative: 25 % (ref 12–46)
Lymphs Abs: 2 10*3/uL (ref 0.7–4.0)
MCH: 33.7 pg (ref 26.0–34.0)
MCHC: 36.4 g/dL — ABNORMAL HIGH (ref 30.0–36.0)
MCV: 92.5 fL (ref 78.0–100.0)
Monocytes Absolute: 0.5 10*3/uL (ref 0.1–1.0)
Monocytes Relative: 6 % (ref 3–12)
Neutro Abs: 5.3 10*3/uL (ref 1.7–7.7)
Neutrophils Relative %: 67 % (ref 43–77)
Platelets: 168 10*3/uL (ref 150–400)
RBC: 4.78 MIL/uL (ref 4.22–5.81)
RDW: 11.7 % (ref 11.5–15.5)
WBC: 8 10*3/uL (ref 4.0–10.5)

## 2013-11-06 LAB — GLUCOSE, CAPILLARY
GLUCOSE-CAPILLARY: 122 mg/dL — AB (ref 70–99)
Glucose-Capillary: 140 mg/dL — ABNORMAL HIGH (ref 70–99)
Glucose-Capillary: 133 mg/dL — ABNORMAL HIGH (ref 70–99)

## 2013-11-06 LAB — APTT: aPTT: 56 seconds — ABNORMAL HIGH (ref 24–37)

## 2013-11-06 LAB — BASIC METABOLIC PANEL
BUN: 13 mg/dL (ref 6–23)
CALCIUM: 9.7 mg/dL (ref 8.4–10.5)
CO2: 22 meq/L (ref 19–32)
Chloride: 100 mEq/L (ref 96–112)
Creatinine, Ser: 0.86 mg/dL (ref 0.50–1.35)
GFR calc Af Amer: >90 mL/min (ref >90)
Glucose, Bld: 139 mg/dL — ABNORMAL HIGH (ref 70–99)
Potassium: 4.2 mEq/L (ref 3.7–5.3)
SODIUM: 138 meq/L (ref 137–147)

## 2013-11-06 LAB — LIPID PANEL
CHOLESTEROL: 193 mg/dL (ref 0–200)
HDL: 30 mg/dL — ABNORMAL LOW (ref >39)
LDL Cholesterol: 123 mg/dL — ABNORMAL HIGH (ref 0–99)
TRIGLYCERIDES: 202 mg/dL — AB (ref <150)
Total CHOL/HDL Ratio: 6.4 RATIO
VLDL: 40 mg/dL (ref 0–40)

## 2013-11-06 LAB — TROPONIN I
TROPONIN I: 18.67 ng/mL — AB (ref <0.30)
Troponin I: 18.65 ng/mL

## 2013-11-06 LAB — MRSA PCR SCREENING: MRSA by PCR: NEGATIVE

## 2013-11-06 LAB — PROTIME-INR
INR: 1.22 (ref 0.00–1.49)
PROTHROMBIN TIME: 15.1 s (ref 11.6–15.2)

## 2013-11-06 LAB — HEMOGLOBIN A1C
Hgb A1c MFr Bld: 5.3 % (ref <5.7)
Mean Plasma Glucose: 105 mg/dL (ref <117)

## 2013-11-06 LAB — MAGNESIUM: MAGNESIUM: 2.2 mg/dL (ref 1.5–2.5)

## 2013-11-06 LAB — POCT ACTIVATED CLOTTING TIME
Activated Clotting Time: 382 s
Activated Clotting Time: 138 seconds

## 2013-11-06 LAB — TSH: TSH: 0.598 u[IU]/mL (ref 0.350–4.500)

## 2013-11-06 MED ORDER — MORPHINE SULFATE 2 MG/ML IJ SOLN
2.0000 mg | INTRAMUSCULAR | Status: DC | PRN
  Administered 2013-11-06: 2 mg via INTRAVENOUS
  Filled 2013-11-06: qty 1

## 2013-11-06 MED ORDER — POTASSIUM CHLORIDE CRYS ER 20 MEQ PO TBCR
40.0000 meq | EXTENDED_RELEASE_TABLET | Freq: Once | ORAL | Status: AC
  Administered 2013-11-06: 40 meq via ORAL
  Filled 2013-11-06: qty 2

## 2013-11-06 MED ORDER — ATROPINE SULFATE 0.1 MG/ML IJ SOLN
INTRAMUSCULAR | Status: AC
  Filled 2013-11-06: qty 10

## 2013-11-06 MED ORDER — PNEUMOCOCCAL VAC POLYVALENT 25 MCG/0.5ML IJ INJ
0.5000 mL | INJECTION | INTRAMUSCULAR | Status: AC
  Administered 2013-11-08: 0.5 mL via INTRAMUSCULAR
  Filled 2013-11-06: qty 0.5

## 2013-11-06 MED ORDER — CARVEDILOL 6.25 MG PO TABS
6.2500 mg | ORAL_TABLET | Freq: Two times a day (BID) | ORAL | Status: DC
  Administered 2013-11-06 – 2013-11-08 (×4): 6.25 mg via ORAL
  Filled 2013-11-06 (×6): qty 1

## 2013-11-06 MED ORDER — MAGNESIUM HYDROXIDE 400 MG/5ML PO SUSP
30.0000 mL | Freq: Four times a day (QID) | ORAL | Status: DC | PRN
  Administered 2013-11-06: 30 mL via ORAL
  Filled 2013-11-06: qty 30

## 2013-11-06 MED FILL — Sodium Chloride IV Soln 0.9%: INTRAVENOUS | Qty: 50 | Status: AC

## 2013-11-06 NOTE — Progress Notes (Signed)
General Surgery [facility]  I was notified of patient's admission 4 days after out-patient [facility] repair with mesh.  I visited patient and discussed his symptoms and subsequent hospitalization.  He appears to be doing well following his stent placement.  Hernia wound is healing uneventfully.  Will see in office 2-3 weeks in follow-up.  Earnstine Regal, MD, [facility] Office: [phone]

## 2013-11-06 NOTE — Progress Notes (Signed)
CRITICAL VALUE ALERT  Critical value received: Troponin 18.67   Date of notification:  11/06/13  Time of notification:  0102  Critical value read back:yes  Nurse who received alert:  [place] Severo Beber RN  MD notified (1st page):  Dr. Terrence Dupont  Time of first page:  0108  Responding MD:  Dr. Terrence Dupont  Time MD responded: [phone]

## 2013-11-06 NOTE — Progress Notes (Signed)
CARDIAC REHAB PHASE I   PRE:  Rate/Rhythm: 43 SR with PVC    BP: sitting 150/83    SaO2:   MODE:  Ambulation: 350 ft   POST:  Rate/Rhythm: 94 SR    BP: sitting 150/92     SaO2:   Pt limping some due to hernia repair and groin. Otherwise tolerated well, no c/o. BP elevated. Return to recliner. Began ed re MI/stent and gave diet sheets. Visitors came. Will f/u in am. Can walk with wife.  [place], ACSM 11/06/2013 11:41 AM

## 2013-11-06 NOTE — Progress Notes (Signed)
Subjective:   Patient denies any chest pain or shortness of breath. Patient tolerated PCI 200% occluded LAD with excellent results.  distal LAD wraps around the apex Objective:  Vital Signs in the last 24 hours: Temp:  [98.6 F (37 C)-98.7 F (37.1 C)] 98.6 F (37 C) (04/21 0700) Pulse Rate:  [67-97] 82 (04/21 0800) Resp:  [9-24] 24 (04/21 0800) BP: (126-160)/(76-101) 148/82 mmHg (04/21 0800) SpO2:  [93 %-97 %] 96 % (04/21 0800) Weight:  [102.513 kg (226 lb)-104 kg (229 lb 4.5 oz)] 104 kg (229 lb 4.5 oz) (04/20 2330)  Intake/Output from previous day: 04/20 0701 - 04/21 0700 In: 1182.5 [P.O.:60; I.V.:1122.5] Out: -  Intake/Output from this shift: Total I/O In: 128 [I.V.:128] Out: -   Physical Exam: Neck: no adenopathy, no carotid bruit, no JVD and supple, symmetrical, trachea midline Lungs: clear to auscultation bilaterally Heart: regular rate and rhythm, S1, S2 normal, no murmur, click, rub or gallop Abdomen: soft, non-tender; bowel sounds normal; no masses,  no organomegaly Extremities: extremities normal, atraumatic, no cyanosis or edema and Right groin stable left groin surgical incision healing well  Lab Results:  Recent Labs  11/05/13 2330 11/06/13 1000  WBC 8.0 7.7  HGB 16.1 16.3  PLT 168 172    Recent Labs  11/05/13 2043 11/05/13 2102 11/05/13 2330  NA 140 139 139  K 3.9 3.5* 4.1  CL 101 101 102  CO2 23  --  24  GLUCOSE 144* 152* 132*  BUN 13 12 13   CREATININE 0.93 1.00 0.88    Recent Labs  11/05/13 2057 11/05/13 2305  TROPONINI 1.62* 18.67*   Hepatic Function Panel  Recent Labs  11/05/13 2330  PROT 6.4  ALBUMIN 3.2*  AST 84*  ALT 39  ALKPHOS 67  BILITOT 0.4   No results found for this basename: CHOL,  in the last 72 hours No results found for this basename: PROTIME,  in the last 72 hours  Imaging: Imaging results have been reviewed and Dg Chest Port 1 View  11/05/2013   CLINICAL DATA:  Chest pain.  Decreased heart rate.  Sweats.   EXAM: PORTABLE CHEST - 1 VIEW  COMPARISON:  None.  FINDINGS: Shallow inspiration. The heart size and mediastinal contours are within normal limits. Both lungs are clear. The visualized skeletal structures are unremarkable.  IMPRESSION: No active disease.   Electronically Signed   By: Lucienne Capers M.D.   On: 11/05/2013 21:28    Cardiac Studies:  Assessment/Plan:  Acute anteroseptal/apical wall myocardial infarction  Elevated blood sugar rule out diabetes mellitus  Status post recent left inguinal hernia repair  Tobacco use plan As per orders   LOS: 1 day    Clent Demark 11/06/2013, 10:31 AM

## 2013-11-06 NOTE — Care Management Note (Signed)
    Page 1 of 1   11/06/2013     9:58:49 AM CARE MANAGEMENT NOTE 11/06/2013  Patient:  Jonathan Lindsey, Jonathan Lindsey   Account Number:  [id number]  Date Initiated:  11/06/2013  Documentation initiated by:  Elissa Hefty  Subjective/Objective Assessment:   adm w mi     Action/Plan:   lives w fam, pcp dr Jill Alexanders   Anticipated DC Date:     Anticipated DC Plan:  [place]  CM consult  Medication Assistance      Choice offered to / List presented to:             Status of service:   Medicare Important Message given?   (If response is "NO", the following Medicare IM given date fields will be blank) Date Medicare IM given:   Date Additional Medicare IM given:    Discharge Disposition:  HOME/SELF CARE  Per UR Regulation:  Reviewed for med. necessity/level of care/duration of stay  If discussed at [place] of Stay Meetings, dates discussed:    Comments:  4/21 0957 debbie Braiden Presutti rn,bsn spoke w pt and wife. he has bcbs ins. he has brilinta 30dy free and copay assist card. his da checked w pharm and his copay w card will be 18.00 per month.

## 2013-11-06 NOTE — Progress Notes (Signed)
Cardiac Cath Sheath Removal Note  Site Area: Right Femoral Artery Site Prior to Removal: Level 0 Pressure Applied For: 27 minutes beginning at 0128 Patient Status during Sheath Pull: VS stable, pt had no complaints of pain, PRN morphine given pre sheath removal  Post Sheath Removal Groin Site: Level 0, soft upon palpation Post Cath Instructions Give? Yes Post Pulses Present? Yes Pressure Dressing Applied? Yes  Therisa Doyne RN

## 2013-11-06 NOTE — Progress Notes (Signed)
Paged to ED room 33 for code stemi. Went to room to meet patient and family. Escorted son to ED waiting room to get family friends to take to cath waiting area while patient was being taken to cath lab. Checked on family while patient was having cath done. After cath checked on patient in his room where his wife and son was in the room with him,

## 2013-11-06 NOTE — Cardiovascular Report (Signed)
NAMEMarland Kitchen  ANTWAINE, BOOMHOWER NO.:  [id number]  MEDICAL RECORD NO.:  [id number]  LOCATION:  2H12C                        FACILITY:  [facility]  PHYSICIAN:  Jaymie Mckiddy N. Terrence Dupont, M.D. DATE OF BIRTH:  1960-10-28  DATE OF PROCEDURE:  11/05/2013 DATE OF DISCHARGE:                           CARDIAC CATHETERIZATION   PROCEDURES: 1. Left cardiac catheterization with selective left and right coronary     angiography, left ventriculography via right groin using Judkins     technique. 2. Successful percutaneous transluminal coronary angioplasty to     proximal 100% occluded left anterior descending coronary artery     using 2.5 x 12 mm long Trek balloon. 3. Successful deployment of 3.5 x 23 mm long Xience Alpine drug-     eluting stent in proximal left anterior descending coronary artery. 4. Successful postdilatation of the stent using 3.5 x 15 mm long      Emerge balloon.  INDICATION FOR THE PROCEDURE:  Mr. Recupero is a 53 year old male with no significant past medical history.  He came to the ER by EMS, complaining of recurrent retrosternal chest pain described as pressure, heaviness as if someone is standing on his chest off and on since his left inguinal surgery for last 3 days.  Today around 6:30 p.m., pain got worse after eating supper, grade 10/10, radiating to the left arm associated with mild shortness of breath, diaphoresis, and pale appearance noted by the family.  The patient denies such episodes of chest pain in the past.  EKG done in the ER showed normal sinus rhythm with minimal ST elevation in lead I to V3 and very minimal reciprocal changes in lead I and aVL, suggestive of acute anteroseptal wall injury. Code STEMI was called, and the patient was taken emergently to the cath lab.  I discussed with the patient and his wife briefly regarding emergency cath, possible PTCA stenting, its risks and benefits, i.e., death, MI, stroke, need for emergency CABG, local  vascular complications, etc., and consented for PCI.  DESCRIPTION OF PROCEDURE:  After obtaining the informed consent, the patient was brought to the cath lab and was placed on fluoroscopy table. Right groin was prepped and draped in usual fashion.  Xylocaine 1% was used for local anesthesia in the right groin.  With the help of thin wall needle, 6-French arterial sheath was placed.  The sheath was aspirated and flushed.  Next, 6-French left Judkins catheter was advanced over the wire under fluoroscopic guidance up to the ascending aorta.  Wire was pulled out.  The catheter was aspirated and connected to the Manifold.  Catheter was further advanced and engaged into left coronary ostium.  Multiple views of the left system were taken.  Next, catheter was disengaged and was pulled out over the wire and was replaced with 6-French right Judkins catheter which was advanced over the wire under fluoroscopic guidance up to the ascending aorta.  Wire was pulled out.  The catheter was aspirated and connected to the Manifold.  Catheter was further advanced and engaged into right coronary ostium.  A single view of right coronary artery was obtained.  Next, catheter was disengaged and was pulled out over the wire and was replaced with  6-French pigtail catheter at the end of the procedure which was advanced over the wire under fluoroscopic guidance up to the ascending aorta.  Wire was pulled out.  The catheter was aspirated and connected to the Manifold.  Catheter was further advanced across the aortic valve into the LV.  LV pressures were recorded.  Next, LV graft was done in 30-degree RAO position.  Post-angiographic pressures were recorded from LV and then pullback pressures were recorded from the aorta.  There was no gradient across the aortic valve.  Next, the pigtail catheter was pulled out over the wire.  Sheaths were aspirated and flushed.  FINDINGS:  LV showed inferoapical wall dyskinesia,  EF of 45% to 50%. Left main was patent.  LAD was 100% occluded after giving off moderate sized diagonal 2.  Diagonal 2 was large which has mild proximal stenosis.  Diagonal 2 has 40% to 50% ostial stenosis.  Ramus was large which was patent.  Left circumflex was small which was patent.  RCA was large which was patent.  INTERVENTIONAL PROCEDURE:  Successful PTCA 200% occluded.  Proximal LAD was done using 2.5 x 12 mm long Trek balloon for predilatation and then 3.5 x 23 mm long Xience Alpine drug-eluting stent was deployed at 10 atmospheric pressure and proximal LAD.  This stent was post dilated using 3.5 x 15 mm long [place] Emerge balloon, going up to 18 atmospheric pressure.  Lesion dilated from 100% to 0% residual with excellent TIMI grade 3 distal flow without evidence of dissection or distal embolization.  The patient received weight-based Angiomax and 80 mg of Brilinta prior to the procedure.  The patient tolerated procedure well. There were no complications.  The patient was transferred to CCU in stable condition.  Door to balloon time in the cath lab from what arrival to cath lab to the perfusion was 16 minutes.     [name]. Terrence Dupont, M.D.     MNH/MEDQ  D:  11/05/2013  T:  11/06/2013  Job:  [id number]

## 2013-11-06 NOTE — Telephone Encounter (Signed)
Patient wife called to notify Dr. Harlow Asa that patient Jonathan Lindsey had a Heart Attack last night.  Patient currently at [facility] in room 2H12.  Patient s/p [facility] repair on 11/02/13.

## 2013-11-07 LAB — TROPONIN I: TROPONIN I: 10.45 ng/mL — AB (ref <0.30)

## 2013-11-07 LAB — GLUCOSE, CAPILLARY
GLUCOSE-CAPILLARY: 112 mg/dL — AB (ref 70–99)
Glucose-Capillary: 139 mg/dL — ABNORMAL HIGH (ref 70–99)
Glucose-Capillary: 119 mg/dL — ABNORMAL HIGH (ref 70–99)
Glucose-Capillary: 115 mg/dL — ABNORMAL HIGH (ref 70–99)
Glucose-Capillary: 118 mg/dL — ABNORMAL HIGH (ref 70–99)
Glucose-Capillary: 125 mg/dL — ABNORMAL HIGH (ref 70–99)

## 2013-11-07 LAB — CBC
HCT: 46 % (ref 39.0–52.0)
Hemoglobin: 16.7 g/dL (ref 13.0–17.0)
MCH: 33.9 pg (ref 26.0–34.0)
MCHC: 36.3 g/dL — ABNORMAL HIGH (ref 30.0–36.0)
MCV: 93.3 fL (ref 78.0–100.0)
PLATELETS: 188 10*3/uL (ref 150–400)
RBC: 4.93 MIL/uL (ref 4.22–5.81)
RDW: 11.8 % (ref 11.5–15.5)
WBC: 7.9 10*3/uL (ref 4.0–10.5)

## 2013-11-07 LAB — BASIC METABOLIC PANEL
BUN: 15 mg/dL (ref 6–23)
CHLORIDE: 101 meq/L (ref 96–112)
CO2: 23 meq/L (ref 19–32)
Calcium: 9.3 mg/dL (ref 8.4–10.5)
Creatinine, Ser: 1.02 mg/dL (ref 0.50–1.35)
GFR calc non Af Amer: 82 mL/min — ABNORMAL LOW (ref >90)
Glucose, Bld: 123 mg/dL — ABNORMAL HIGH (ref 70–99)
Potassium: 4.2 mEq/L (ref 3.7–5.3)
Sodium: 139 mEq/L (ref 137–147)

## 2013-11-07 LAB — HEMOGLOBIN A1C
Hgb A1c MFr Bld: 5.4 % (ref <5.7)
Mean Plasma Glucose: 108 mg/dL (ref <117)

## 2013-11-07 NOTE — Progress Notes (Signed)
Subjective:  Patient denies any chest pain or shortness of breath. Denies any palpitations.  Objective:  Vital Signs in the last 24 hours: Temp:  [98.2 F (36.8 C)-99.1 F (37.3 C)] 99.1 F (37.3 C) (04/22 0800) Pulse Rate:  [82-91] 84 (04/22 0600) Resp:  [8-23] 16 (04/21 2200) BP: (121-150)/(70-92) 130/70 mmHg (04/22 0800) SpO2:  [92 %-97 %] 97 % (04/22 0800)  Intake/Output from previous day: 04/21 0701 - 04/22 0700 In: 368 [P.O.:240; I.V.:128] Out: 1050 [Urine:1050] Intake/Output from this shift: Total I/O In: 360 [P.O.:360] Out: -   Physical Exam: Neck: no adenopathy, no carotid bruit, no JVD and supple, symmetrical, trachea midline Lungs: clear to auscultation bilaterally Heart: regular rate and rhythm, S1, S2 normal, no murmur, click, rub or gallop Abdomen: soft, non-tender; bowel sounds normal; no masses,  no organomegaly Extremities: extremities normal, atraumatic, no cyanosis or edema and Right groin stable left internal area surgical site healing well  Lab Results:  Recent Labs  11/06/13 1000 11/07/13 0348  WBC 7.7 7.9  HGB 16.3 16.7  PLT 172 188    Recent Labs  11/06/13 1000 11/07/13 0348  NA 138 139  K 4.2 4.2  CL 100 101  CO2 22 23  GLUCOSE 139* 123*  BUN 13 15  CREATININE 0.86 1.02    Recent Labs  11/06/13 1000 11/07/13 0340  TROPONINI 18.65* 10.45*   Hepatic Function Panel  Recent Labs  11/05/13 2330  PROT 6.4  ALBUMIN 3.2*  AST 84*  ALT 39  ALKPHOS 67  BILITOT 0.4    Recent Labs  11/06/13 1000  CHOL 193   No results found for this basename: PROTIME,  in the last 72 hours  Imaging: Imaging results have been reviewed and Dg Chest Port 1 View  11/05/2013   CLINICAL DATA:  Chest pain.  Decreased heart rate.  Sweats.  EXAM: PORTABLE CHEST - 1 VIEW  COMPARISON:  None.  FINDINGS: Shallow inspiration. The heart size and mediastinal contours are within normal limits. Both lungs are clear. The visualized skeletal structures are  unremarkable.  IMPRESSION: No active disease.   Electronically Signed   By: Lucienne Capers M.D.   On: 11/05/2013 21:28    Cardiac Studies:  Assessment/Plan:  Status post Acute anteroseptal/apical wall myocardial infarction status post PCI 200% occluded LAD with excellent results Elevated blood sugar rule out diabetes mellitus  Status post recent left inguinal hernia repair  Tobacco use Hypercholesteremia Plan  continue present management Check labs in a.m. Phase I cardiac rehabilitation Transfer to telemetry Possible discharge tomorrow if stable  LOS: 2 days    Clent Demark 11/07/2013, 12:03 PM

## 2013-11-07 NOTE — Progress Notes (Signed)
CARDIAC REHAB PHASE I   Pt has been walking independently wihtout problems. Feels well. Ed completed with wife, pt and friend. Voiced understanding and requests his name be sent to [place].  [phone]  [place], ACSM 11/07/2013 11:55 AM

## 2013-11-08 LAB — BASIC METABOLIC PANEL
BUN: 20 mg/dL (ref 6–23)
CALCIUM: 9.5 mg/dL (ref 8.4–10.5)
CO2: 25 mEq/L (ref 19–32)
CREATININE: 1.14 mg/dL (ref 0.50–1.35)
Chloride: 102 mEq/L (ref 96–112)
GFR calc non Af Amer: 72 mL/min — ABNORMAL LOW (ref >90)
GFR, EST AFRICAN AMERICAN: 83 mL/min — AB (ref >90)
Glucose, Bld: 113 mg/dL — ABNORMAL HIGH (ref 70–99)
POTASSIUM: 4.5 meq/L (ref 3.7–5.3)
Sodium: 140 mEq/L (ref 137–147)

## 2013-11-08 LAB — CBC
HCT: 46.1 % (ref 39.0–52.0)
Hemoglobin: 16.5 g/dL (ref 13.0–17.0)
MCH: 33.5 pg (ref 26.0–34.0)
MCHC: 35.8 g/dL (ref 30.0–36.0)
MCV: 93.7 fL (ref 78.0–100.0)
Platelets: 194 10*3/uL (ref 150–400)
RBC: 4.92 MIL/uL (ref 4.22–5.81)
RDW: 11.7 % (ref 11.5–15.5)
WBC: 9 10*3/uL (ref 4.0–10.5)

## 2013-11-08 LAB — TROPONIN I: TROPONIN I: 5.08 ng/mL — AB (ref <0.30)

## 2013-11-08 LAB — GLUCOSE, CAPILLARY: Glucose-Capillary: 109 mg/dL — ABNORMAL HIGH (ref 70–99)

## 2013-11-08 MED ORDER — TICAGRELOR 90 MG PO TABS: 90.0000 mg | ORAL_TABLET | Freq: Two times a day (BID) | ORAL | Status: DC

## 2013-11-08 MED ORDER — RAMIPRIL 2.5 MG PO CAPS: 2.5000 mg | ORAL_CAPSULE | Freq: Every day | ORAL | Status: DC

## 2013-11-08 MED ORDER — ATORVASTATIN CALCIUM 80 MG PO TABS: 80.0000 mg | ORAL_TABLET | Freq: Every day | ORAL | Status: DC

## 2013-11-08 MED ORDER — NITROGLYCERIN 0.4 MG SL SUBL: 0.4000 mg | SUBLINGUAL_TABLET | SUBLINGUAL | Status: DC | PRN

## 2013-11-08 MED ORDER — CARVEDILOL 6.25 MG PO TABS: 6.2500 mg | ORAL_TABLET | Freq: Two times a day (BID) | ORAL | Status: DC

## 2013-11-08 MED ORDER — ASPIRIN 81 MG PO CHEW: 81.0000 mg | CHEWABLE_TABLET | Freq: Every day | ORAL | Status: DC

## 2013-11-08 NOTE — Discharge Summary (Signed)
  Discharge summary dictated on 11/08/2013 dictation number is [phone]

## 2013-11-08 NOTE — Discharge Instructions (Signed)
Acute Coronary Syndrome Acute coronary syndrome (ACS) is an urgent problem in which the blood and oxygen supply to the heart is critically deficient. ACS requires hospitalization because one or more coronary arteries may be blocked. ACS represents a range of conditions including:  Previous angina that is now unstable, lasts longer, happens at rest, or is more intense.  A heart attack, with heart muscle cell injury and death. There are three vital coronary arteries that supply the heart muscle with blood and oxygen so that it can pump blood effectively. If blockages to these arteries develop, blood flow to the heart muscle is reduced. If the heart does not get enough blood, angina may occur as the first warning sign. SYMPTOMS   The most common signs of angina include:  Tightness or squeezing in the chest.  Feeling of heaviness on the chest.  Discomfort in the arms, neck, or jaw.  Shortness of breath and nausea.  Cold, wet skin.  Angina is usually brought on by physical effort or excitement which increase the oxygen needs of the heart. These states increase the blood flow needs of the heart beyond what can be delivered. TREATMENT   Medicines to help discomfort may include nitroglycerin (nitro) in the form of tablets or a spray for rapid relief, or longer-acting forms such as cream, patches, or capsules. (Be aware that there are many side effects and possible interactions with other drugs).  Other medicines may be used to help the heart pump better.  Procedures to open blocked arteries including angioplasty or stent placement to keep the arteries open.  Open heart surgery may be needed when there are many blockages or they are in critical locations that are best treated with surgery. HOME CARE INSTRUCTIONS   Avoid smoking.  Take one baby or adult aspirin daily, if your caregiver advises. This helps reduce the risk of a heart attack.  It is very important that you follow the  angina treatment prescribed by your caregiver. Make arrangements for proper follow-up care.  Eat a heart healthy diet with salt and fat restrictions as advised.  Regular exercise is good for you as long as it does not cause discomfort. Do not begin any new type of exercise until you check with your caregiver.  If you are overweight, you should lose weight.  Try to maintain normal blood lipid levels.  Keep your blood pressure under control as recommended by your caregiver.  You should tell your caregiver right away about any increase in the severity or frequency of your chest discomfort or angina attacks. When you have angina, you should stop what you are doing and sit down. This may bring relief in 3 to 5 minutes. If your caregiver has prescribed nitro, take it as directed.  If your caregiver has given you a follow-up appointment, it is very important to keep that appointment. Not keeping the appointment could result in a chronic or permanent injury, pain, and disability. If there is any problem keeping the appointment, you must call back to this facility for assistance. SEEK IMMEDIATE MEDICAL CARE IF:   You develop nausea, vomiting, or shortness of breath.  You feel faint, lightheaded, or pass out.  Your chest discomfort gets worse.  You are sweating or experience sudden profound fatigue.  You do not get relief of your chest pain after 3 doses of nitro.  Your discomfort lasts longer than 15 minutes. MAKE SURE YOU:   Understand these instructions.  Will watch your condition.  Will get help  right away if you are not doing well or get worse. Document Released: 07/05/2005 Document Revised: 09/27/2011 Document Reviewed: 02/06/2008 [facility] Patient Information 2014 [place]. Coronary Angiography with Stent Coronary angiography with stent placement is a procedure to widen or open a narrow blood vessel of the heart (coronary artery). When a coronary artery becomes partially  blocked, it decreases blood flow to that area. This may lead to chest pain or a heart attack (myocardial infarction). Arteries may become blocked by cholesterol buildup (plaque) in the lining or wall.  A stent is a small piece of metal that looks like a mesh or a spring. Stent placement may be done right after a coronary angiography in which a blocked artery is found or as a treatment for a heart attack.  LET [facility] CARE PROVIDER KNOW ABOUT:  Any allergies you have.   All medicines you are taking, including vitamins, herbs, eye drops, creams, and over-the-counter medicines.   Previous problems you or members of your family have had with the use of anesthetics.   Any blood disorders you have.   Previous surgeries you have had.   Medical conditions you have. RISKS AND COMPLICATIONS Generally, coronary angiography with stent is a safe procedure. However, as with any procedure, complications can occur. Possible complications include:   Damage to the heart or its blood vessels.   A return of blockage.   Bleeding at the site.   Blood clot in another part of the body.   Kidney injury.   Allergic reaction to the dye or contrast used.  BEFORE THE PROCEDURE  Do not eat or drink anything for 6 hours before the procedure.   Ask your health care provider if medicines can be taken with a sip of water.   Your health care provider will make sure you understand the procedure and the risks and potential complications associated with the procedure.  PROCEDURE  You may be given a medicine to help you relax before and during the procedure (sedative). This medicine will be given through an IV tube that is put into one of your veins.   The area where the catheter will be inserted is shaved and cleaned. This is usually done in the groin but may be done in the fold of your arm (near your elbow) or in the wrist.   A medicine will be given to numb the area where the catheter will  be inserted (local anesthetic).   The catheter is inserted into an artery using a guide wire. A type of X-ray (fluoroscopy) is used to help guide the catheter to the opening of the blocked artery.   A dye is then injected into the catheter, and X-rays are taken. The dye helps to show where any narrowing or blockages are located in the heart arteries.   A tiny wire is guided to the blocked spot, and a balloon is inflated to make the artery wider. The stent is expanded and crushes the plaque into the wall of the vessel. The stent holds the area open like a scaffolding and improves the blood flow.   Sometimes the artery may be made wider using a laser or other tools to remove plaque.   When the blood flow is better, the catheter is removed. The lining of the artery will grow over the stent, which stays where it was placed.  AFTER THE PROCEDURE  If the procedure is done through the leg, you will be kept in bed lying flat for about 6  hours. You will be instructed to not bend or cross your legs.   The insertion site will be checked frequently.   The pulse in your feet or wrist will be checked frequently.   Additional blood tests, X-rays, and electrocardiography may be done. Document Released: 01/09/2003 Document Revised: 04/25/2013 Document Reviewed: 01/11/2013 [facility] Patient Information 2014 [place].   Myocardial Infarction A myocardial infarction (MI) is also called a heart attack. It causes damage to the heart that cannot be fixed. An MI often happens when a blood clot or other blockage cuts blood flow to the heart. When this happens, certain areas of the heart begin to die. This is an emergency. HOME CARE  Take medicine as told by your doctor.  Change certain behaviors as told by your doctor. This may include:  Quitting smoking.  Being active.  Keeping a healthy weight.  Eating a heart-healthy diet. Ask your doctor for help with this diet.  Keeping your  diabetes under control.  Lessening stress.  Limiting how much alcohol you drink. GET HELP RIGHT AWAY IF:  You have crushing or pressure-like chest pain that spreads to the arms, back, neck, or jaw. Call your local emergency services (911 in [place] Do not drive yourself to the hospital.  You have severe chest pain.  You have shortness of breath during rest, sleep, or with activity.  You have sudden sweating or clammy skin.  You feel sick to your stomach (nauseous) and throw up (vomit).  You suddenly get lightheaded or dizzy.  You feel your heart beating fast or skipping beats. MAKE SURE YOU:   Understand these instructions.  Will watch your condition.  Will get help right away if you are not doing well or get worse. Document Released: 01/04/2012 Document Reviewed: 01/04/2012 [facility] Patient Information 2014 [place], [place].

## 2013-11-09 NOTE — Discharge Summary (Signed)
NAMEMarland Lindsey  NUR, KRASINSKI NO.:  [id number]  MEDICAL RECORD NO.:  [id number]  LOCATION:  3W32C                        FACILITY:  [facility]  PHYSICIAN:  Koby Pickup N. Terrence Dupont, M.D. DATE OF BIRTH:  1961-04-23  DATE OF ADMISSION:  11/05/2013 DATE OF DISCHARGE:  11/08/2013                              DISCHARGE SUMMARY   ADMITTING DIAGNOSES: 1. Acute anteroseptal wall myocardial infarction. 2. Elevated blood sugar, rule out diabetes mellitus. 3. Status post recent left inguinal hernia repair.  FINAL DIAGNOSES: 1. Status post acute anteroseptal/apical wall myocardial infarction     status post percutaneous coronary intervention to 100% occluded     left anterior descending coronary artery with excellent results. 2. Glucose intolerance. 3. Hypercholesteremia. 4. History of tobacco use. 5. Status post recent left inguinal hernia repair.  DISCHARGE HOME MEDICATIONS: 1. Aspirin 81 mg 1 tablet daily. 2. Atorvastatin 80 mg 1 tablet daily. 3. Carvedilol 6.25 mg 1 tablet twice daily. 4. Nitrostat 0.4 mg sublingual use as directed. 5. Ramipril 2.5 mg 1 capsule daily. 6. Brilinta 90 mg 1 tablet daily. 7. Colace 100 mg twice daily as needed. 8. Eye vitamins 1 tablet daily. 9. Fish oil 1000 mg 1 capsule daily. 10.Norco 1 tablet every 6 hours as needed for moderate pain.  DIET:  Low salt, low cholesterol 1800 calories ADA diet.  ACTIVITY:  Increase activity slowly as tolerated.  The patient will be scheduled for phase 2 cardiac rehab as outpatient.  CONDITION AT DISCHARGE:  Stable.  The patient has been discussed at length regarding diet, lifestyle modification, and compliance with medications.  FOLLOWUP:  With me in 1 week.  Follow up with Dr. Harlow Asa in 2-3 weeks as scheduled.  CONDITION ON DISCHARGE:  Stable.  BRIEF HISTORY AND HOSPITAL COURSE:  Jonathan Lindsey is 53 year old male with no significant past medical history.  He came to the ER by EMS complaining of recurrent  retrosternal chest pain described as pressure, heaviness as if someone is standing on his chest off and on since his left inguinal surgery for last 3 days.  Today around 6:30 p.m., pain got worse after eating supper, grade 10/10 radiating to left arm associated with mild shortness of breath, diaphoresis, and pale appearance noted by family.  The patient denies such episodes of chest pain in the past. EKG done in the ER showed normal sinus rhythm with minimal ST elevation in lead V1 to V3 and very minimal reciprocal changes in lead 1 and aVL suggestive of acute anteroseptal wall injury.  Code STEMI was called. The patient was taken emergently to the cath lab.  PAST MEDICAL HISTORY:  As above.  PAST SURGICAL HISTORY:  The patient had recent left inguinal hernia surgery 3 days ago, had appendectomy in the past, had eye surgery in the past.  PHYSICAL EXAMINATION:  GENERAL:  He was alert, awake, oriented x3. VITAL SIGNS:  Blood pressure was 145/97, pulse was 82.  He was afebrile. EYES:  Conjunctivae was pink. NECK:  Supple.  No JVD.  No bruit. LUNGS:  Clear to auscultation without rhonchi or rales. CARDIOVASCULAR:  S1, S2 was normal.  There was soft systolic murmur and S4 gallop. ABDOMEN:  Soft.  Bowel sounds were  present, nontender.  Left groin in the inguinal hernia.  Surgical area dry. EXTREMITIES:  There is no clubbing, cyanosis, or edema.  LABS:  Admission lab, sodium was 140, potassium 3.9, BUN 13, creatinine 0.93, hemoglobin was 17.2, hematocrit 47.4, white count of 8.6.  First set of troponin I in the ED was 1.62.  Repeat troponin I post PCI was 18.67, repeat was 18.65, yesterday was 10.5, today is 5.08 which is trending down.  His blood sugar was 144.  Repeat fasting blood sugar today is 113.  Hemoglobin is 16.5, hematocrit 46.1, white count of 9.0. Sodium is 140, potassium 4.5, BUN 20, creatinine 1.14.  His hemoglobin A1c was 5.3.  Cholesterol was 193, triglycerides 202, HDL  was low 30, LDL 123.  Repeat EKG showed normal sinus rhythm with Q-waves in leads III, aVF with mild ST elevation and evolving anteroseptal and mild ST-T wave changes in the anteroseptal leads.  BRIEF HOSPITAL COURSE:  The patient was taken emergently to the cath lab.  The patient underwent emergency PTCA stenting to proximal LAD, 100% occluded proximal LAD with excellent results.  The patient's LAD wraps around the apex and the inferior wall.  LV gram showed minimal mild inferoapical wall dyskinesia.  Post procedure, patient did not have any episodes of chest pain during the hospital stay.  His groin is stable with no evidence of hematoma or bruit.  Phase 1 cardiac rehab was called.  The patient has been ambulating in room and hallway without any problems.  The patient will be discharged home on above medications and will be followed up in my office in 1 week.  The patient will be scheduled for phase 2 cardiac rehab as outpatient.     [name]. Terrence Dupont, M.D.     MNH/MEDQ  D:  11/08/2013  T:  11/08/2013  Job:  [id number]

## 2013-11-15 ENCOUNTER — Encounter (HOSPITAL_COMMUNITY)
Admission: RE | Admit: 2013-11-15 | Discharge: 2013-11-15 | Disposition: A | Discharge status: Home / Self Care | Payer: BC Managed Care – PPO | Source: Ambulatory Visit | Attending: Cardiology | Admitting: Cardiology

## 2013-11-15 NOTE — Progress Notes (Signed)
Cardiac Rehab Medication Review by a Pharmacist  Does the patient  feel that his/her medications are working for him/her?  yes  Has the patient been experiencing any side effects to the medications prescribed?  no  Does the patient measure his/her own blood pressure or blood glucose at home?  yes He checks his blood pressure at home  Does the patient have any problems obtaining medications due to transportation or finances?   no  Understanding of regimen: good Understanding of indications: good Potential of compliance: excellent     Jonathan Lindsey 11/15/2013 8:46 AM

## 2013-11-16 ENCOUNTER — Telehealth (HOSPITAL_COMMUNITY): Payer: Self-pay | Admitting: *Deleted

## 2013-11-16 NOTE — Telephone Encounter (Signed)
Message copied by Rowe Pavy on Fri Nov 16, 2013  8:57 AM ------      Message from: Earnstine Regal      Created: Thu Nov 15, 2013  4:26 PM      Regarding: RE: Madaline Brilliant to proceed with Cardiac Rehab       I see no problem with him participating in cardiac rehab.  Only restriction is no lifting greater than 25 lbs.            Thanks,            tmg            Earnstine Regal, MD, [facility]      Office: [phone]                  ----- Message -----         From: Rowe Pavy, RN         Sent: 11/15/2013  11:04 AM           To: Earnstine Regal, MD      Subject: Ok to proceed with Cardiac Rehab                         Dr. Harlow Asa,            Pt referred to cardiac rehab s/p 4/20 Stemi and DES to LAD.  Recently had left inguinal hernia repair on 11/02/13.  Pt scheduled to see you in follow up on 5/4 at 2:30pm.            Okay for pt to proceed with exercise at Cardiac Rehab?            Any restrictions of activity or weight limitations?            Our exercise equipment includes walking on the track, treadmill, stationary bike and stepper.  We use light hand weights on Mondays and Fridays - we start at 1-2 pounds and progress over time to 10 pounds. As endurance and stamina improve can progress to rower and elliptical over time.                  Thanks for the clearance and input.            Carlette Carlton       ------

## 2013-11-19 ENCOUNTER — Ambulatory Visit (INDEPENDENT_AMBULATORY_CARE_PROVIDER_SITE_OTHER): Payer: PRIVATE HEALTH INSURANCE | Admitting: Surgery

## 2013-11-19 ENCOUNTER — Encounter (INDEPENDENT_AMBULATORY_CARE_PROVIDER_SITE_OTHER): Payer: Self-pay | Admitting: Surgery

## 2013-11-19 ENCOUNTER — Encounter (HOSPITAL_COMMUNITY): Payer: BC Managed Care – PPO

## 2013-11-19 VITALS — BP 118/72 | HR 97 | Temp 97.0°F | Resp 18 | Ht 71.0 in | Wt 226.0 lb

## 2013-11-19 DIAGNOSIS — K4090 Unilateral inguinal hernia, without obstruction or gangrene, not specified as recurrent: Secondary | ICD-10-CM

## 2013-11-19 NOTE — Progress Notes (Signed)
General Surgery [facility]  Chief Complaint  Patient presents with  . Routine Post Op    post op hernia repair 11/02/2013    HISTORY: The patient is a 53 year old male who underwent left inguinal hernia repair with mesh. 4 days following his procedure he developed chest pain and required urgent catheterization and stent placement. Patient is now recovering and will begin cardiac rehabilitation in the near future.  EXAM: Surgical incision is healing nicely. Mild to moderate soft tissue swelling. Palpation in the inguinal canal with cough and Valsalva shows no sign of recurrence. No sign of infection.  IMPRESSION: Status post left inguinal hernia repair with mesh  PLAN: Patient will begin applying topical creams to his incision. He continues to use ice packs as needed. His activity is restricted 25 pounds lifting for the next 3 weeks.  Patient will return in 6 weeks for final wound check.  Earnstine Regal, MD, [facility]   Visit Diagnoses: 1. Inguinal hernia, left

## 2013-11-19 NOTE — Patient Instructions (Signed)
  CARE OF INCISION   Apply cocoa butter/vitamin E cream (Palmer's brand) to your incision 2 - 3 times daily.  Massage cream into incision for one minute with each application.  Use sunscreen (50 SPF or higher) for first 6 months after surgery if area is exposed to sun.  You may alternate Mederma or other scar reducing cream with cocoa butter cream if desired.       Tameshia Bonneville M. Loreley Schwall, MD, FACS      [facility]      Office: [phone]    

## 2013-11-21 ENCOUNTER — Encounter (HOSPITAL_COMMUNITY)
Admission: RE | Admit: 2013-11-21 | Discharge: 2013-11-21 | Disposition: A | Discharge status: Home / Self Care | Payer: BC Managed Care – PPO | Source: Ambulatory Visit | Attending: Cardiology | Admitting: Cardiology

## 2013-11-21 DIAGNOSIS — Z9861 Coronary angioplasty status: Secondary | ICD-10-CM | POA: Insufficient documentation

## 2013-11-21 DIAGNOSIS — I219 Acute myocardial infarction, unspecified: Secondary | ICD-10-CM | POA: Insufficient documentation

## 2013-11-21 NOTE — Progress Notes (Signed)
Pt in today for his first day of exercise at the 6:45 cardiac Rehab phase II program.  Pt tolerated light exercise with no complaints and denies any groin tenderness.  Monitor showed Sr with small P wave, tall T wave and freq pvcs.  Most recent EKG showed STEMI unable to compare EKG strip.  Will send strips over to Dr. Terrence Dupont for his review.  Medication list reconciled.  PHQ2 score 0.  Pt denies any depression and feels optimistic about his future and his recovery. Pt reports that his short term goal is to develop a exercise routine.  Will plan to meet with pt to talk to him about his home exercise and access to equipment.  Pt unable to stay today for education class- exercising on your own. Pt given handouts to preview and will plan to attend when the class is offered again.  Pt long term goal is to understand how to decrease risk of event.  It will be important for pt to attend offered education classes that focus in on those risk factors that pt's have control over to slow the progression of heart disease.  Will continue to monitor pt progress toward these achievable goals. Cherre Huger, BSN

## 2013-11-23 ENCOUNTER — Encounter (HOSPITAL_COMMUNITY)
Admission: RE | Admit: 2013-11-23 | Discharge: 2013-11-23 | Disposition: A | Discharge status: Home / Self Care | Payer: BC Managed Care – PPO | Source: Ambulatory Visit | Attending: Cardiology | Admitting: Cardiology

## 2013-11-26 ENCOUNTER — Encounter (HOSPITAL_COMMUNITY)
Admission: RE | Admit: 2013-11-26 | Discharge: 2013-11-26 | Disposition: A | Discharge status: Home / Self Care | Payer: BC Managed Care – PPO | Source: Ambulatory Visit | Attending: Cardiology | Admitting: Cardiology

## 2013-11-28 ENCOUNTER — Encounter (HOSPITAL_COMMUNITY)
Admission: RE | Admit: 2013-11-28 | Discharge: 2013-11-28 | Disposition: A | Discharge status: Home / Self Care | Payer: BC Managed Care – PPO | Source: Ambulatory Visit | Attending: Cardiology | Admitting: Cardiology

## 2013-11-30 ENCOUNTER — Encounter: Payer: Self-pay | Admitting: Internal Medicine

## 2013-11-30 ENCOUNTER — Encounter (HOSPITAL_COMMUNITY)
Admission: RE | Admit: 2013-11-30 | Discharge: 2013-11-30 | Disposition: A | Discharge status: Home / Self Care | Payer: BC Managed Care – PPO | Source: Ambulatory Visit | Attending: Cardiology | Admitting: Cardiology

## 2013-12-03 ENCOUNTER — Encounter (HOSPITAL_COMMUNITY)
Admission: RE | Admit: 2013-12-03 | Discharge: 2013-12-03 | Disposition: A | Discharge status: Home / Self Care | Payer: BC Managed Care – PPO | Source: Ambulatory Visit | Attending: Cardiology | Admitting: Cardiology

## 2013-12-05 ENCOUNTER — Encounter (HOSPITAL_COMMUNITY)
Admission: RE | Admit: 2013-12-05 | Discharge: 2013-12-05 | Disposition: A | Discharge status: Home / Self Care | Payer: BC Managed Care – PPO | Source: Ambulatory Visit | Attending: Cardiology | Admitting: Cardiology

## 2013-12-05 NOTE — Progress Notes (Signed)
I have reviewed home exercise with Jonathan Lindsey on 12/03/13. The patient was advised to walk 2-4 days per week outside of CRP II for 15 minutes, 2 times per day until he can walk 30 minutes continuously.  Pt will also complete one additional day of hand weights outside of CRP II.  Progression of exercise prescription was discussed.  Reviewed THR, pulse, RPE, sign and symptoms, NTG use and when to call 911 or MD.  Pt voiced understanding.  Archie Endo, MS, ACSM RCEP 12/05/2013 8:04 AM

## 2013-12-07 ENCOUNTER — Encounter (HOSPITAL_COMMUNITY)
Admission: RE | Admit: 2013-12-07 | Discharge: 2013-12-07 | Disposition: A | Discharge status: Home / Self Care | Payer: BC Managed Care – PPO | Source: Ambulatory Visit | Attending: Cardiology | Admitting: Cardiology

## 2013-12-07 NOTE — Progress Notes (Signed)
Jonathan Lindsey [place] 53 y.o. male Nutrition Note Spoke with pt.  Nutrition Survey reviewed with pt. Pt is following Step 2 of the Therapeutic Lifestyle Changes diet. Pt wants to lose wt. Pt has been trying to lose wt by decreasing portion sizes. Pt reports his highest wt was 238-240 lb. Wt today (218 lb) 99.3 kg, which is down 6.6 lb (3 kg) over approximately 3 weeks. Rate of wt loss appears safe at this time. Wt loss tips reviewed. Pt expressed understanding of the information reviewed. Pt aware of nutrition education classes offered and plans on attending nutrition classes.  Nutrition Diagnosis   Food-and nutrition-related knowledge deficit related to lack of exposure to information as related to diagnosis of: ? CVD ? Pre-DM (A1c 5.4) ?   Obesity related to excessive energy intake as evidenced by a BMI of 31.5  Nutrition Intervention   Benefits of adopting Therapeutic Lifestyle Changes discussed when Medficts reviewed.   Pt to attend the Portion Distortion class   Pt to attend the  ? Nutrition I class                     ? Nutrition II class   Pt given handouts for: ? Nutrition I class ? Nutrition II class   Continue client-centered nutrition education by RD, as part of interdisciplinary care.  Goal(s)   Pt to identify food quantities necessary to achieve: ? wt loss to a goal wt of 201-219 lb (91.4-99.6 kg) at graduation from cardiac rehab.   Monitor and Evaluate progress toward nutrition goal with team. Nutrition Risk:  Low   Derek Mound, M.Ed, RD, LDN, CDE 12/07/2013 7:49 AM

## 2013-12-10 ENCOUNTER — Encounter (HOSPITAL_COMMUNITY): Payer: BC Managed Care – PPO

## 2013-12-12 ENCOUNTER — Encounter (HOSPITAL_COMMUNITY)
Admission: RE | Admit: 2013-12-12 | Discharge: 2013-12-12 | Disposition: A | Discharge status: Home / Self Care | Payer: BC Managed Care – PPO | Source: Ambulatory Visit | Attending: Cardiology | Admitting: Cardiology

## 2013-12-12 NOTE — Progress Notes (Signed)
Pt with transient bundle branch block during exercise.  Pt denies any complaints.  Will fax strips and rehab report for Dr. Christain Sacramento to review.  Md is in the office this morning and received fax.

## 2013-12-14 ENCOUNTER — Encounter (HOSPITAL_COMMUNITY)
Admission: RE | Admit: 2013-12-14 | Discharge: 2013-12-14 | Disposition: A | Discharge status: Home / Self Care | Payer: BC Managed Care – PPO | Source: Ambulatory Visit | Attending: Cardiology | Admitting: Cardiology

## 2013-12-17 ENCOUNTER — Encounter (HOSPITAL_COMMUNITY)
Admission: RE | Admit: 2013-12-17 | Discharge: 2013-12-17 | Disposition: A | Discharge status: Home / Self Care | Payer: BC Managed Care – PPO | Source: Ambulatory Visit | Attending: Cardiology | Admitting: Cardiology

## 2013-12-17 DIAGNOSIS — Z9861 Coronary angioplasty status: Secondary | ICD-10-CM | POA: Insufficient documentation

## 2013-12-17 DIAGNOSIS — I219 Acute myocardial infarction, unspecified: Secondary | ICD-10-CM | POA: Insufficient documentation

## 2013-12-19 ENCOUNTER — Encounter (HOSPITAL_COMMUNITY)
Admission: RE | Admit: 2013-12-19 | Discharge: 2013-12-19 | Disposition: A | Discharge status: Home / Self Care | Payer: BC Managed Care – PPO | Source: Ambulatory Visit | Attending: Cardiology | Admitting: Cardiology

## 2013-12-21 ENCOUNTER — Encounter (HOSPITAL_COMMUNITY)
Admission: RE | Admit: 2013-12-21 | Discharge: 2013-12-21 | Disposition: A | Discharge status: Home / Self Care | Payer: BC Managed Care – PPO | Source: Ambulatory Visit | Attending: Cardiology | Admitting: Cardiology

## 2013-12-24 ENCOUNTER — Encounter (HOSPITAL_COMMUNITY)
Admission: RE | Admit: 2013-12-24 | Discharge: 2013-12-24 | Disposition: A | Discharge status: Home / Self Care | Payer: BC Managed Care – PPO | Source: Ambulatory Visit | Attending: Cardiology | Admitting: Cardiology

## 2013-12-26 ENCOUNTER — Encounter (HOSPITAL_COMMUNITY)
Admission: RE | Admit: 2013-12-26 | Discharge: 2013-12-26 | Disposition: A | Discharge status: Home / Self Care | Payer: BC Managed Care – PPO | Source: Ambulatory Visit | Attending: Cardiology | Admitting: Cardiology

## 2013-12-26 DIAGNOSIS — Z5189 Encounter for other specified aftercare: Secondary | ICD-10-CM | POA: Insufficient documentation

## 2013-12-26 DIAGNOSIS — I2109 ST elevation (STEMI) myocardial infarction involving other coronary artery of anterior wall: Secondary | ICD-10-CM | POA: Diagnosis present

## 2013-12-28 ENCOUNTER — Encounter (HOSPITAL_COMMUNITY)
Admission: RE | Admit: 2013-12-28 | Discharge: 2013-12-28 | Disposition: A | Discharge status: Home / Self Care | Payer: BC Managed Care – PPO | Source: Ambulatory Visit | Attending: Cardiology | Admitting: Cardiology

## 2013-12-31 ENCOUNTER — Encounter (HOSPITAL_COMMUNITY)
Admission: RE | Admit: 2013-12-31 | Discharge: 2013-12-31 | Disposition: A | Discharge status: Home / Self Care | Payer: BC Managed Care – PPO | Source: Ambulatory Visit | Attending: Cardiology | Admitting: Cardiology

## 2014-01-02 ENCOUNTER — Encounter (HOSPITAL_COMMUNITY)
Admission: RE | Admit: 2014-01-02 | Discharge: 2014-01-02 | Disposition: A | Discharge status: Home / Self Care | Payer: BC Managed Care – PPO | Source: Ambulatory Visit | Attending: Cardiology | Admitting: Cardiology

## 2014-01-04 ENCOUNTER — Ambulatory Visit (INDEPENDENT_AMBULATORY_CARE_PROVIDER_SITE_OTHER): Payer: PRIVATE HEALTH INSURANCE | Admitting: Surgery

## 2014-01-04 ENCOUNTER — Encounter (HOSPITAL_COMMUNITY)
Admission: RE | Admit: 2014-01-04 | Discharge: 2014-01-04 | Disposition: A | Discharge status: Home / Self Care | Payer: BC Managed Care – PPO | Source: Ambulatory Visit | Attending: Cardiology | Admitting: Cardiology

## 2014-01-04 ENCOUNTER — Encounter (INDEPENDENT_AMBULATORY_CARE_PROVIDER_SITE_OTHER): Payer: Self-pay | Admitting: Surgery

## 2014-01-04 VITALS — BP 135/85 | HR 77 | Temp 98.3°F | Resp 14 | Ht 71.0 in | Wt 219.4 lb

## 2014-01-04 DIAGNOSIS — K4090 Unilateral inguinal hernia, without obstruction or gangrene, not specified as recurrent: Secondary | ICD-10-CM

## 2014-01-04 NOTE — Progress Notes (Signed)
General Surgery [facility]  Chief Complaint  Patient presents with  . Routine Post Op    LIH repair 11/02/2013    HISTORY: Patient is a 53 year old male who underwent left inguinal hernia repair in April 2015. Postoperative course was complicated by a cardiac event requiring cardiac catheterization and stent placement. He has recovered well and is in the midst of cardiac rehabilitation. He returns today for final wound check.  EXAM: Left inguinal incision is well-healed. No significant soft tissue swelling. With cough and Valsalva there is no sign of recurrence.  IMPRESSION: Status post left inguinal hernia repair with mesh  PLAN: Patient is released to full activity without restriction. He will return for surgical care as needed.  Earnstine Regal, MD, [facility]   Visit Diagnoses: 1. Inguinal hernia, left

## 2014-01-07 ENCOUNTER — Encounter (HOSPITAL_COMMUNITY)
Admission: RE | Admit: 2014-01-07 | Discharge: 2014-01-07 | Disposition: A | Discharge status: Home / Self Care | Payer: BC Managed Care – PPO | Source: Ambulatory Visit | Attending: Cardiology | Admitting: Cardiology

## 2014-01-09 ENCOUNTER — Encounter (HOSPITAL_COMMUNITY)
Admission: RE | Admit: 2014-01-09 | Discharge: 2014-01-09 | Disposition: A | Discharge status: Home / Self Care | Payer: BC Managed Care – PPO | Source: Ambulatory Visit | Attending: Cardiology | Admitting: Cardiology

## 2014-01-11 ENCOUNTER — Encounter (HOSPITAL_COMMUNITY)
Admission: RE | Admit: 2014-01-11 | Discharge: 2014-01-11 | Disposition: A | Discharge status: Home / Self Care | Payer: BC Managed Care – PPO | Source: Ambulatory Visit | Attending: Cardiology | Admitting: Cardiology

## 2014-01-14 ENCOUNTER — Encounter (HOSPITAL_COMMUNITY)
Admission: RE | Admit: 2014-01-14 | Discharge: 2014-01-14 | Disposition: A | Discharge status: Home / Self Care | Payer: BC Managed Care – PPO | Source: Ambulatory Visit | Attending: Cardiology | Admitting: Cardiology

## 2014-01-16 ENCOUNTER — Encounter (HOSPITAL_COMMUNITY)
Admission: RE | Admit: 2014-01-16 | Discharge: 2014-01-16 | Disposition: A | Discharge status: Home / Self Care | Payer: BC Managed Care – PPO | Source: Ambulatory Visit | Attending: Cardiology | Admitting: Cardiology

## 2014-01-16 DIAGNOSIS — Z9861 Coronary angioplasty status: Secondary | ICD-10-CM | POA: Insufficient documentation

## 2014-01-16 DIAGNOSIS — I219 Acute myocardial infarction, unspecified: Secondary | ICD-10-CM | POA: Insufficient documentation

## 2014-01-18 ENCOUNTER — Encounter (HOSPITAL_COMMUNITY): Payer: BC Managed Care – PPO

## 2014-01-21 ENCOUNTER — Encounter (HOSPITAL_COMMUNITY): Payer: BC Managed Care – PPO

## 2014-01-23 ENCOUNTER — Encounter (HOSPITAL_COMMUNITY): Payer: BC Managed Care – PPO

## 2014-01-25 ENCOUNTER — Encounter (HOSPITAL_COMMUNITY): Payer: BC Managed Care – PPO

## 2014-01-28 ENCOUNTER — Encounter (HOSPITAL_COMMUNITY): Payer: BC Managed Care – PPO

## 2014-01-30 ENCOUNTER — Encounter (HOSPITAL_COMMUNITY): Payer: BC Managed Care – PPO

## 2014-02-01 ENCOUNTER — Encounter (HOSPITAL_COMMUNITY): Payer: BC Managed Care – PPO

## 2014-02-04 ENCOUNTER — Encounter (HOSPITAL_COMMUNITY)
Admission: RE | Admit: 2014-02-04 | Discharge: 2014-02-04 | Disposition: A | Discharge status: Home / Self Care | Payer: BC Managed Care – PPO | Source: Ambulatory Visit | Attending: Cardiology | Admitting: Cardiology

## 2014-02-04 NOTE — Progress Notes (Signed)
Pt returned from extended absence due to the new addition of first grand child in [place].  Weight elevation noted.  Pt denies any sob or swelling.  Pt with weight gain of 2.1.  Pt has been at this weight before. Cherre Huger, BSN

## 2014-02-06 ENCOUNTER — Encounter (HOSPITAL_COMMUNITY)
Admission: RE | Admit: 2014-02-06 | Discharge: 2014-02-06 | Disposition: A | Discharge status: Home / Self Care | Payer: BC Managed Care – PPO | Source: Ambulatory Visit | Attending: Cardiology | Admitting: Cardiology

## 2014-02-06 ENCOUNTER — Encounter (HOSPITAL_COMMUNITY): Payer: BC Managed Care – PPO

## 2014-02-06 DIAGNOSIS — I219 Acute myocardial infarction, unspecified: Secondary | ICD-10-CM | POA: Insufficient documentation

## 2014-02-06 DIAGNOSIS — Z9861 Coronary angioplasty status: Secondary | ICD-10-CM | POA: Insufficient documentation

## 2014-02-08 ENCOUNTER — Encounter (HOSPITAL_COMMUNITY)
Admission: RE | Admit: 2014-02-08 | Discharge: 2014-02-08 | Disposition: A | Discharge status: Home / Self Care | Payer: BC Managed Care – PPO | Source: Ambulatory Visit | Attending: Cardiology | Admitting: Cardiology

## 2014-02-08 ENCOUNTER — Encounter (HOSPITAL_COMMUNITY): Payer: BC Managed Care – PPO

## 2014-02-11 ENCOUNTER — Encounter (HOSPITAL_COMMUNITY)
Admission: RE | Admit: 2014-02-11 | Discharge: 2014-02-11 | Disposition: A | Discharge status: Home / Self Care | Payer: BC Managed Care – PPO | Source: Ambulatory Visit | Attending: Cardiology | Admitting: Cardiology

## 2014-02-11 ENCOUNTER — Encounter (HOSPITAL_COMMUNITY): Payer: BC Managed Care – PPO

## 2014-02-13 ENCOUNTER — Encounter (HOSPITAL_COMMUNITY)
Admission: RE | Admit: 2014-02-13 | Discharge: 2014-02-13 | Disposition: A | Discharge status: Home / Self Care | Payer: BC Managed Care – PPO | Source: Ambulatory Visit | Attending: Cardiology | Admitting: Cardiology

## 2014-02-13 ENCOUNTER — Encounter (HOSPITAL_COMMUNITY): Payer: BC Managed Care – PPO

## 2014-02-15 ENCOUNTER — Encounter (HOSPITAL_COMMUNITY): Payer: BC Managed Care – PPO

## 2014-02-15 ENCOUNTER — Encounter (HOSPITAL_COMMUNITY)
Admission: RE | Admit: 2014-02-15 | Discharge: 2014-02-15 | Disposition: A | Discharge status: Home / Self Care | Payer: BC Managed Care – PPO | Source: Ambulatory Visit | Attending: Cardiology | Admitting: Cardiology

## 2014-02-18 ENCOUNTER — Encounter (HOSPITAL_COMMUNITY): Payer: BC Managed Care – PPO

## 2014-02-18 ENCOUNTER — Encounter (HOSPITAL_COMMUNITY)
Admission: RE | Admit: 2014-02-18 | Discharge: 2014-02-18 | Disposition: A | Discharge status: Home / Self Care | Payer: BC Managed Care – PPO | Source: Ambulatory Visit | Attending: Internal Medicine | Admitting: Internal Medicine

## 2014-02-18 DIAGNOSIS — Z9861 Coronary angioplasty status: Secondary | ICD-10-CM | POA: Diagnosis present

## 2014-02-18 DIAGNOSIS — I219 Acute myocardial infarction, unspecified: Secondary | ICD-10-CM | POA: Diagnosis not present

## 2014-02-20 ENCOUNTER — Encounter (HOSPITAL_COMMUNITY)
Admission: RE | Admit: 2014-02-20 | Discharge: 2014-02-20 | Disposition: A | Discharge status: Home / Self Care | Payer: BC Managed Care – PPO | Source: Ambulatory Visit | Attending: Cardiology | Admitting: Cardiology

## 2014-02-20 ENCOUNTER — Encounter (HOSPITAL_COMMUNITY): Payer: BC Managed Care – PPO

## 2014-02-20 DIAGNOSIS — I219 Acute myocardial infarction, unspecified: Secondary | ICD-10-CM | POA: Diagnosis not present

## 2014-02-22 ENCOUNTER — Ambulatory Visit: Payer: BC Managed Care – PPO | Admitting: Internal Medicine

## 2014-02-22 ENCOUNTER — Institutional Professional Consult (permissible substitution): Payer: BC Managed Care – PPO | Admitting: Internal Medicine

## 2014-02-22 ENCOUNTER — Encounter (HOSPITAL_COMMUNITY)
Admission: RE | Admit: 2014-02-22 | Discharge: 2014-02-22 | Disposition: A | Discharge status: Home / Self Care | Payer: BC Managed Care – PPO | Source: Ambulatory Visit | Attending: Cardiology | Admitting: Cardiology

## 2014-02-22 ENCOUNTER — Encounter (HOSPITAL_COMMUNITY): Payer: BC Managed Care – PPO

## 2014-02-22 ENCOUNTER — Ambulatory Visit (INDEPENDENT_AMBULATORY_CARE_PROVIDER_SITE_OTHER): Payer: BC Managed Care – PPO | Admitting: Internal Medicine

## 2014-02-22 ENCOUNTER — Encounter: Payer: Self-pay | Admitting: Internal Medicine

## 2014-02-22 VITALS — BP 116/78 | HR 53 | Ht 71.0 in | Wt 224.0 lb

## 2014-02-22 DIAGNOSIS — I219 Acute myocardial infarction, unspecified: Secondary | ICD-10-CM | POA: Diagnosis not present

## 2014-02-22 DIAGNOSIS — R079 Chest pain, unspecified: Secondary | ICD-10-CM

## 2014-02-22 NOTE — Patient Instructions (Signed)
Your physician recommends that you schedule a follow-up appointment in: End of December  Your physician recommends that you return for lab work. Lipid, BMET, CBC, AST  Your physician has requested that you have an echocardiogram. Echocardiography is a painless test that uses sound waves to create images of your heart. It provides your doctor with information about the size and shape of your heart and how well your heart's chambers and valves are working. This procedure takes approximately one hour. There are no restrictions for this procedure.

## 2014-02-22 NOTE — Progress Notes (Addendum)
HPI Patient is a 53 yo who is self referred for continued care   He has a history of CAD that dates to April 2015.  He had just had an inguinal hernal repair.  Went home  Did not feel good  Heart burn with meals  Attrib to pain meds.  Monday after surgery he felt like elephant was sitting on chest.  Went to ER  EKG showed minimal ST elevation In IV1 to V3.  Underewent emergent LHC  Found to have 100% LAD  Underewent PCI with Xienc Alpine DES to prox LAD  Also had 50 to 60^ diag lesion  LVEF was 45 to 50%  He was sent home on ASA/Brilinta/Coreg and Altace Since d/c he has had no further symptoms   Breating is good  No CP  He is doing cardiac rehab.  Did have some PVCs that were noted early in rehab   Breathing is good  No chest tightnesss   Did note inguinal scar was a little red this AM  No injury  No fever.   No Known Allergies  Current Outpatient Prescriptions  Medication Sig Dispense Refill  . aspirin 81 MG chewable tablet Chew 1 tablet (81 mg total) by mouth daily.  30 tablet  3  . atorvastatin (LIPITOR) 80 MG tablet Take 1 tablet (80 mg total) by mouth daily at 6 PM.  30 tablet  3  . carvedilol (COREG) 6.25 MG tablet Take 1 tablet (6.25 mg total) by mouth 2 (two) times daily with a meal.  60 tablet  3  . docusate sodium (COLACE) 100 MG capsule Take 100 mg by mouth 2 (two) times daily.      . Multiple Vitamins-Minerals (EYE VITAMINS PO) Take 1 tablet by mouth daily.      . nitroGLYCERIN (NITROSTAT) 0.4 MG SL tablet Place 1 tablet (0.4 mg total) under the tongue every 5 (five) minutes x 3 doses as needed for chest pain.  25 tablet  12  . Omega-3 Fatty Acids (FISH OIL) 1000 MG CAPS Take 1 capsule by mouth daily.      . ramipril (ALTACE) 2.5 MG capsule Take 1 capsule (2.5 mg total) by mouth daily.  30 capsule  3  . ticagrelor (BRILINTA) 90 MG TABS tablet Take 1 tablet (90 mg total) by mouth 2 (two) times daily.  60 tablet  12   No current facility-administered medications for this visit.     Past Medical History  Diagnosis Date  . Myocardial infarction 2015    Past Surgical History  Procedure Laterality Date  . Hernia repair  1990    NOT SURE ??rih WITH MESH  . Appendectomy    . Eye surgery  2003    EYE DETACHMENT RETINA AND BUCKLES    Family History  Problem Relation Age of Onset  . Diabetes Mother   . Hyperlipidemia Mother   . Hypertension Mother   . Cancer Father     MULTIPLE MELANOMAS    History   Social History  . Marital Status: Single    Spouse Name: N/A    Number of Children: N/A  . Years of Education: N/A   Occupational History  . Not on file.   Social History Main Topics  . Smoking status: Never Smoker   . Smokeless tobacco: Never Used  . Alcohol Use: Yes     Comment: rare  . Drug Use: No  . Sexual Activity: Yes   Other Topics Concern  . Not on file  Social History Narrative  . No narrative on file    Review of Systems:  All systems reviewed.  They are negative to the above problem except as previously stated.  Vital Signs: BP 116/78  Pulse 53  Ht 5\' 11"  (1.803 m)  Wt 224 lb (101.606 kg)  BMI 31.26 kg/m2  Physical Exam Patient is in NAD   HEENT:  Normocephalic, atraumatic. EOMI, PERRLA.  Neck: JVP is normal.  No bruits.  Lungs: clear to auscultation. No rales no wheezes.  Heart: Regular rate and rhythm. Normal S1, S2. No S3.   No significant murmurs. PMI not displaced.  Abdomen:  Supple, nontender. Normal bowel sounds. No masses. No hepatomegaly.  Extremities:   Good distal pulses throughout. No lower extremity edema.  Musculoskeletal :moving all extremities.  Neuro:   alert and oriented x3.  CN II-XII grossly intact.  EKG  SR 65 bpm   Assessment and Plan:  1.  CAD  Doing very well  Asymptomatic  Would continue cardiac rehab  Keep on same meds Tolerating with no dizziness.  Check labs. Set up for echo to reeval LVEF Keep on ASA and Brilinta.    2.  HL  Check lipds  3.  Surgery.  Incsion has very small amount  of erythema and swelling along medial aspect of incision.  He says this is new.  Nontender.  Follow for the next few days  If does not go away or gets worse would contact T Gerkin

## 2014-02-25 ENCOUNTER — Encounter (HOSPITAL_COMMUNITY)
Admission: RE | Admit: 2014-02-25 | Discharge: 2014-02-25 | Disposition: A | Discharge status: Home / Self Care | Payer: BC Managed Care – PPO | Source: Ambulatory Visit | Attending: Cardiology | Admitting: Cardiology

## 2014-02-25 ENCOUNTER — Encounter (HOSPITAL_COMMUNITY): Payer: BC Managed Care – PPO

## 2014-02-25 DIAGNOSIS — I219 Acute myocardial infarction, unspecified: Secondary | ICD-10-CM | POA: Insufficient documentation

## 2014-02-25 DIAGNOSIS — Z9861 Coronary angioplasty status: Secondary | ICD-10-CM | POA: Diagnosis present

## 2014-02-27 ENCOUNTER — Encounter (HOSPITAL_COMMUNITY)
Admission: RE | Admit: 2014-02-27 | Discharge: 2014-02-27 | Disposition: A | Discharge status: Home / Self Care | Payer: BC Managed Care – PPO | Source: Ambulatory Visit | Attending: Cardiology | Admitting: Cardiology

## 2014-02-27 ENCOUNTER — Encounter (HOSPITAL_COMMUNITY): Payer: BC Managed Care – PPO

## 2014-02-27 DIAGNOSIS — I219 Acute myocardial infarction, unspecified: Secondary | ICD-10-CM | POA: Diagnosis not present

## 2014-02-27 NOTE — Progress Notes (Addendum)
Pt will graduate from the cardiac rehab program with the completion of 36 exercise session in Phase II.  Pt maintained good attendance and progressed nicely during his participation in rehab as evident by increased MET level.  Pt advised to monitor his HR closely during cool down due to needing extended time to achieve HR within 10 of his resting HR.  Pt verbalized understanding.  Stressed the importance of allowing that time to transition from exercise to cool down.  Pt feels he has achieved his goals during his participation however his home exercise is only on the weekends due to the high demands of family and work life.  Pt plans to begin transitioning to exercising during the week once he completes cardiac rehab.  Pt advised of maintenance program here at the hospital as a transition to home exercise, pt declined and feels he will be able to exercise during the week consistently.  Informed pt that if he found he was unable to do this on his own he can always return to the maintenance program. Repeat PHq2 score 0, medication list reconciled.  Pt established care with Dr. Harrington Challenger and will have echo on Friday. It was a pleasure working with this patient. Cherre Huger, BSN

## 2014-03-01 ENCOUNTER — Ambulatory Visit (HOSPITAL_COMMUNITY): Payer: BC Managed Care – PPO | Attending: Cardiovascular Disease | Admitting: Cardiology

## 2014-03-01 ENCOUNTER — Encounter (HOSPITAL_COMMUNITY)
Admission: RE | Admit: 2014-03-01 | Discharge: 2014-03-01 | Disposition: A | Discharge status: Home / Self Care | Payer: BC Managed Care – PPO | Source: Ambulatory Visit | Attending: Cardiology | Admitting: Cardiology

## 2014-03-01 ENCOUNTER — Encounter (HOSPITAL_COMMUNITY): Payer: BC Managed Care – PPO

## 2014-03-01 ENCOUNTER — Encounter (HOSPITAL_COMMUNITY): Payer: Self-pay

## 2014-03-01 DIAGNOSIS — I2510 Atherosclerotic heart disease of native coronary artery without angina pectoris: Secondary | ICD-10-CM

## 2014-03-01 DIAGNOSIS — R079 Chest pain, unspecified: Secondary | ICD-10-CM

## 2014-03-01 DIAGNOSIS — I219 Acute myocardial infarction, unspecified: Secondary | ICD-10-CM | POA: Diagnosis not present

## 2014-03-01 NOTE — Progress Notes (Signed)
Echo performed. 

## 2014-03-04 ENCOUNTER — Encounter (HOSPITAL_COMMUNITY): Payer: BC Managed Care – PPO

## 2014-03-06 ENCOUNTER — Encounter (HOSPITAL_COMMUNITY): Payer: BC Managed Care – PPO

## 2014-03-08 ENCOUNTER — Encounter (HOSPITAL_COMMUNITY): Payer: BC Managed Care – PPO

## 2014-03-11 ENCOUNTER — Encounter (HOSPITAL_COMMUNITY): Payer: BC Managed Care – PPO

## 2014-03-13 ENCOUNTER — Encounter (HOSPITAL_COMMUNITY): Payer: BC Managed Care – PPO

## 2014-03-15 ENCOUNTER — Encounter (HOSPITAL_COMMUNITY): Payer: BC Managed Care – PPO

## 2014-03-18 ENCOUNTER — Encounter (HOSPITAL_COMMUNITY): Payer: BC Managed Care – PPO

## 2014-03-20 ENCOUNTER — Encounter (HOSPITAL_COMMUNITY): Payer: BC Managed Care – PPO

## 2014-03-22 ENCOUNTER — Encounter (HOSPITAL_COMMUNITY): Payer: BC Managed Care – PPO

## 2014-04-01 ENCOUNTER — Telehealth: Payer: Self-pay | Admitting: Internal Medicine

## 2014-04-01 MED ORDER — ATORVASTATIN CALCIUM 80 MG PO TABS: 80.0000 mg | ORAL_TABLET | Freq: Every day | ORAL | Status: DC

## 2014-04-01 NOTE — Telephone Encounter (Signed)
Refill complete 

## 2014-04-01 NOTE — Telephone Encounter (Signed)
Received fax refill request  Rx # [id number] Medication:  Atorvastatin 80 mg tablet  Qty 30 Sig:  Take one tablet by mouth every day at 6 pm Physician:  Harrington Challenger

## 2014-04-01 NOTE — Telephone Encounter (Signed)
Received fax refill request  Rx # [id number] Medication:  Carvedilol 6.25 mg tablet Qty 60 Sig:  Take one tablet by mouth twice a day with a meal Physician:  Harrington Challenger Received fax refill request  Rx # [id number] Medication:  Ramipril 2.5 mg capsule Qty 30 Sig:  Take one capsule by mouth every day Physician:  Harrington Challenger

## 2014-04-03 ENCOUNTER — Telehealth: Payer: Self-pay | Admitting: Internal Medicine

## 2014-04-03 MED ORDER — CARVEDILOL 6.25 MG PO TABS: 6.2500 mg | ORAL_TABLET | Freq: Two times a day (BID) | ORAL | Status: DC

## 2014-04-03 MED ORDER — RAMIPRIL 2.5 MG PO CAPS: 2.5000 mg | ORAL_CAPSULE | Freq: Every day | ORAL | Status: DC

## 2014-04-03 NOTE — Telephone Encounter (Signed)
Medications sent to pharmacy

## 2014-04-03 NOTE — Telephone Encounter (Signed)
Received fax refill request  Rx # [id number] Medication:  Ramipril 2.5 mg capsule Qty 30 Sig:  Take one capsule by mouth every day Physician:  Harrington Challenger Received fax refill request  Rx # [id number] Medication:  Carvedilol 6.25 mg tablet Qty 60 Sig:  Take one tablet by mouth twice a day with a meal Physician:  Harrington Challenger

## 2014-05-03 ENCOUNTER — Other Ambulatory Visit: Payer: Self-pay | Admitting: Internal Medicine

## 2014-06-24 ENCOUNTER — Other Ambulatory Visit (HOSPITAL_COMMUNITY): Payer: BC Managed Care – PPO

## 2014-06-26 ENCOUNTER — Other Ambulatory Visit (INDEPENDENT_AMBULATORY_CARE_PROVIDER_SITE_OTHER): Payer: BC Managed Care – PPO | Admitting: *Deleted

## 2014-06-26 DIAGNOSIS — R079 Chest pain, unspecified: Secondary | ICD-10-CM

## 2014-06-26 LAB — LIPID PANEL
CHOLESTEROL: 113 mg/dL (ref 0–200)
HDL: 27.8 mg/dL — ABNORMAL LOW (ref >39.00)
LDL Cholesterol: 57 mg/dL (ref 0–99)
NONHDL: 85.2
Total CHOL/HDL Ratio: 4
Triglycerides: 140 mg/dL (ref 0.0–149.0)
VLDL: 28 mg/dL (ref 0.0–40.0)

## 2014-06-27 ENCOUNTER — Encounter (HOSPITAL_COMMUNITY): Payer: Self-pay | Admitting: Cardiology

## 2014-07-05 ENCOUNTER — Encounter: Payer: Self-pay | Admitting: Internal Medicine

## 2014-07-05 ENCOUNTER — Ambulatory Visit (INDEPENDENT_AMBULATORY_CARE_PROVIDER_SITE_OTHER): Payer: BC Managed Care – PPO | Admitting: Internal Medicine

## 2014-07-05 VITALS — BP 127/70 | HR 67 | Ht 71.0 in | Wt 237.0 lb

## 2014-07-05 DIAGNOSIS — I252 Old myocardial infarction: Secondary | ICD-10-CM

## 2014-07-05 MED ORDER — ATORVASTATIN CALCIUM 40 MG PO TABS: 40.0000 mg | ORAL_TABLET | Freq: Every day | ORAL | Status: DC

## 2014-07-05 NOTE — Patient Instructions (Signed)
Your physician has recommended you make the following change in your medication:  1.) decrease lipitor to 40 mg daily Your physician recommends that you return for lab work in: 2 months (LIPIDS, AST, CBC, BMET) Your physician wants you to follow-up in: May 2016.  You will receive a reminder letter in the mail two months in advance. If you don't receive a letter, please call our office to schedule the follow-up appointment.

## 2014-07-05 NOTE — Progress Notes (Signed)
HPI Patient is a 53 yo who is self referred for continued care   He has a history of CAD that dates to April 2015.  He had just had an inguinal hernal repair.  Went home  Did not feel good  Heart burn with meals  Attrib to pain meds.  Monday after surgery he felt like elephant was sitting on chest.  Went to ER  EKG showed minimal ST elevation In IV1 to V3.  Underewent emergent LHC  Found to have 100% LAD  Underewent PCI with Xienc Alpine DES to prox LAD  Also had 50 to 60^ diag lesion  LVEF was 45 to 50%  He was sent home on ASA/Brilinta/Coreg and Altace Since d/c he has had no further symptoms   Breating is good  No CP  He is doing cardiac rehab.  Did have some PVCs that were noted early in rehab   Breathing is good  No chest tightnesss   Did note inguinal scar was a little red this AM  No injury  No fever.   He was last in clinic in August 2015 No Known Allergies  Current Outpatient Prescriptions  Medication Sig Dispense Refill  . aspirin 81 MG chewable tablet Chew 1 tablet (81 mg total) by mouth daily. 30 tablet 3  . atorvastatin (LIPITOR) 80 MG tablet Take 1 tablet (80 mg total) by mouth daily at 6 PM. 30 tablet 6  . carvedilol (COREG) 6.25 MG tablet Take 1 tablet (6.25 mg total) by mouth 2 (two) times daily with a meal. 60 tablet 3  . Multiple Vitamins-Minerals (EYE VITAMINS PO) Take 1 tablet by mouth daily.    . Omega-3 Fatty Acids (FISH OIL) 1000 MG CAPS Take 1 capsule by mouth daily.    . ramipril (ALTACE) 2.5 MG capsule Take 1 capsule (2.5 mg total) by mouth daily. 30 capsule 3  . ticagrelor (BRILINTA) 90 MG TABS tablet Take 1 tablet (90 mg total) by mouth 2 (two) times daily. 60 tablet 12  . nitroGLYCERIN (NITROSTAT) 0.4 MG SL tablet Place 1 tablet (0.4 mg total) under the tongue every 5 (five) minutes x 3 doses as needed for chest pain. (Patient not taking: Reported on 07/05/2014) 25 tablet 12   No current facility-administered medications for this visit.    Past Medical History   Diagnosis Date  . Myocardial infarction 2015    Past Surgical History  Procedure Laterality Date  . Hernia repair  1990    NOT SURE ??rih WITH MESH  . Appendectomy    . Eye surgery  2003    EYE DETACHMENT RETINA AND BUCKLES  . Left heart catheterization with coronary angiogram N/A 11/05/2013    Procedure: LEFT HEART CATHETERIZATION WITH CORONARY ANGIOGRAM;  Surgeon: Clent Demark, MD;  Location: [facility] C[facility] LAB;  Service: Cardiovascular;  Laterality: N/A;    Family History  Problem Relation Age of Onset  . Diabetes Mother   . Hyperlipidemia Mother   . Hypertension Mother   . Cancer Father     MULTIPLE MELANOMAS    History   Social History  . Marital Status: Single    Spouse Name: N/A    Number of Children: N/A  . Years of Education: N/A   Occupational History  . Not on file.   Social History Main Topics  . Smoking status: Never Smoker   . Smokeless tobacco: Never Used  . Alcohol Use: Yes     Comment: rare  . Drug Use: No  .  Sexual Activity: Yes   Other Topics Concern  . Not on file   Social History Narrative    Review of Systems:  All systems reviewed.  They are negative to the above problem except as previously stated.  Vital Signs: BP 127/70 mmHg  Pulse 67  Ht 5\' 11"  (1.803 m)  Wt 237 lb (107.502 kg)  BMI 33.07 kg/m2  Physical Exam Patient is in NAD   HEENT:  Normocephalic, atraumatic. EOMI, PERRLA.  Neck: JVP is normal.  No bruits.  Lungs: clear to auscultation. No rales no wheezes.  Heart: Regular rate and rhythm. Normal S1, S2. No S3.   No significant murmurs. PMI not displaced.  Abdomen:  Supple, nontender. Normal bowel sounds. No masses. No hepatomegaly.  Extremities:   Good distal pulses throughout. No lower extremity edema.  Musculoskeletal :moving all extremities.  Neuro:   alert and oriented x3.  CN II-XII grossly intact.  Assessment and Plan:  1.  CAD  DOing well  No CP  No SOB  Echo shows LVEF has normalized Keep on ASA and Brilinta.   Check labs in 2 months    2.  HL LDL was excellent at 57  Would decrease to 40 Lipitro and check lipids in 8 wks

## 2014-07-17 ENCOUNTER — Other Ambulatory Visit: Payer: Self-pay | Admitting: Internal Medicine

## 2014-07-22 ENCOUNTER — Other Ambulatory Visit: Payer: Self-pay | Admitting: Internal Medicine

## 2014-08-11 ENCOUNTER — Other Ambulatory Visit: Payer: Self-pay | Admitting: Internal Medicine

## 2014-08-13 ENCOUNTER — Other Ambulatory Visit: Payer: Self-pay | Admitting: Internal Medicine

## 2014-09-05 ENCOUNTER — Other Ambulatory Visit: Payer: BC Managed Care – PPO

## 2014-09-10 ENCOUNTER — Other Ambulatory Visit (INDEPENDENT_AMBULATORY_CARE_PROVIDER_SITE_OTHER): Payer: BLUE CROSS/BLUE SHIELD | Admitting: *Deleted

## 2014-09-10 DIAGNOSIS — I252 Old myocardial infarction: Secondary | ICD-10-CM

## 2014-09-10 LAB — LIPID PANEL
CHOLESTEROL: 129 mg/dL (ref 0–200)
HDL: 29.8 mg/dL — ABNORMAL LOW (ref >39.00)
LDL CALC: 70 mg/dL (ref 0–99)
NonHDL: 99.2
Total CHOL/HDL Ratio: 4
Triglycerides: 146 mg/dL (ref 0.0–149.0)
VLDL: 29.2 mg/dL (ref 0.0–40.0)

## 2014-09-10 LAB — CBC
HCT: 46.3 % (ref 39.0–52.0)
Hemoglobin: 16.1 g/dL (ref 13.0–17.0)
MCHC: 34.8 g/dL (ref 30.0–36.0)
MCV: 94.8 fl (ref 78.0–100.0)
PLATELETS: 175 10*3/uL (ref 150.0–400.0)
RBC: 4.88 Mil/uL (ref 4.22–5.81)
RDW: 12.3 % (ref 11.5–15.5)
WBC: 5.9 10*3/uL (ref 4.0–10.5)

## 2014-09-10 LAB — BASIC METABOLIC PANEL
BUN: 12 mg/dL (ref 6–23)
CALCIUM: 9 mg/dL (ref 8.4–10.5)
CHLORIDE: 106 meq/L (ref 96–112)
CO2: 26 mEq/L (ref 19–32)
CREATININE: 0.89 mg/dL (ref 0.40–1.50)
GFR: 94.57 mL/min (ref >60.00)
Glucose, Bld: 111 mg/dL — ABNORMAL HIGH (ref 70–99)
Potassium: 3.9 mEq/L (ref 3.5–5.1)
Sodium: 138 mEq/L (ref 135–145)

## 2014-09-10 LAB — AST: AST: 26 U/L (ref 0–37)

## 2014-09-10 NOTE — Addendum Note (Signed)
Addended by: Eulis Foster on: 09/10/2014 07:38 AM   Modules accepted: Orders

## 2014-11-22 ENCOUNTER — Encounter: Payer: Self-pay | Admitting: Internal Medicine

## 2014-11-22 ENCOUNTER — Ambulatory Visit (INDEPENDENT_AMBULATORY_CARE_PROVIDER_SITE_OTHER): Payer: BLUE CROSS/BLUE SHIELD | Admitting: Internal Medicine

## 2014-11-22 VITALS — BP 140/80 | HR 75 | Ht 71.0 in | Wt 238.6 lb

## 2014-11-22 DIAGNOSIS — E785 Hyperlipidemia, unspecified: Secondary | ICD-10-CM | POA: Diagnosis not present

## 2014-11-22 NOTE — Patient Instructions (Signed)
Medication Instructions:  Your physician recommends that you continue on your current medications as directed. Please refer to the Current Medication list given to you today.  Labwork: NONE  Testing/Procedures: NONE  Follow-Up: Your physician wants you to follow-up in: 9 months with Dr. Harrington Challenger. You will receive a reminder letter in the mail two months in advance. If you don't receive a letter, please call our office to schedule the follow-up appointment.   Any Other Special Instructions Will Be Listed Below (If Applicable).

## 2014-11-22 NOTE — Progress Notes (Signed)
Cardiology Office Note   Date:  11/22/2014   ID:  Reed Pandy Mccampbell, DOB [birth year], MRN [id number]  PCP:  Wyatt Haste, MD  Cardiologist:   Dorris Carnes, MD   No chief complaint on file.     History of Present Illness: Jonathan Lindsey is a 54 y.o. male with a history of He has a history of CAD that dates to April 2015. He had just had an inguinal hernal repair. Went home Did not feel good Heart burn with meals Attrib to pain meds. Monday after surgery he felt like elephant was sitting on chest. Went to ER EKG showed minimal ST elevation In IV1 to V3. Underewent emergent LHC Found to have 100% LAD Underewent PCI with Xienc Alpine DES to prox LAD Also had 50 to 60^ diag lesion LVEF was 45 to 50% He was sent home on ASA/Brilinta/Coreg and Altace Since d/c he has had no further symptoms  Breating is good No CP He is doing cardiac rehab. Did have some PVCs that were noted early in rehab  Breathing is good No chest tightnesss  Did note inguinal scar was a little red this AM No injury No fever.  He was last in clinic in Dec 2015 SInce seen he has done well from a cardiac standpoint  Denies chest pressure  No SOB  No heaviness    Current Outpatient Prescriptions  Medication Sig Dispense Refill  . aspirin 81 MG chewable tablet Chew 1 tablet (81 mg total) by mouth daily. 30 tablet 3  . atorvastatin (LIPITOR) 40 MG tablet Take 1 tablet (40 mg total) by mouth daily. 90 tablet 3  . carvedilol (COREG) 6.25 MG tablet TAKE 1 TABLET (6.25 MG TOTAL) BY MOUTH 2 (TWO) TIMES DAILY WITH A MEAL. 60 tablet 5  . Multiple Vitamins-Minerals (EYE VITAMINS PO) Take 1 tablet by mouth daily.    . nitroGLYCERIN (NITROSTAT) 0.4 MG SL tablet Place 1 tablet (0.4 mg total) under the tongue every 5 (five) minutes x 3 doses as needed for chest pain. 25 tablet 12  . Omega-3 Fatty Acids (FISH OIL) 1000 MG CAPS Take 1 capsule by mouth daily.    . ramipril (ALTACE) 2.5 MG capsule  TAKE ONE CAPSULE EVERY DAY 30 capsule 5  . ticagrelor (BRILINTA) 90 MG TABS tablet Take 1 tablet (90 mg total) by mouth 2 (two) times daily. 60 tablet 12   No current facility-administered medications for this visit.    Allergies:   Review of patient's allergies indicates no known allergies.   Past Medical History  Diagnosis Date  . Myocardial infarction 2015    Past Surgical History  Procedure Laterality Date  . Hernia repair  1990    NOT SURE ??rih WITH MESH  . Appendectomy    . Eye surgery  2003    EYE DETACHMENT RETINA AND BUCKLES  . Left heart catheterization with coronary angiogram N/A 11/05/2013    Procedure: LEFT HEART CATHETERIZATION WITH CORONARY ANGIOGRAM;  Surgeon: Clent Demark, MD;  Location: [facility] CATH LAB;  Service: Cardiovascular;  Laterality: N/A;     Social History:  The patient  reports that he has never smoked. He has never used smokeless tobacco. He reports that he drinks alcohol. He reports that he does not use illicit drugs.   Family History:  The patient's family history includes Cancer in his father; Diabetes in his mother; Hyperlipidemia in his mother; Hypertension in his mother.    ROS:  Please see the history of  present illness. All other systems are reviewed and  Negative to the above problem except as noted.    PHYSICAL EXAM: VS:  BP 140/80 mmHg  Pulse 75  Ht 5\' 11"  (1.803 m)  Wt 238 lb 9.6 oz (108.228 kg)  BMI 33.29 kg/m2  GEN: Well nourished, well developed, in no acute distress HEENT: normal Neck: no JVD, carotid bruits, or masses Cardiac: RRR; no murmurs, rubs, or gallops,no edema  Respiratory:  clear to auscultation bilaterally, normal work of breathing GI: soft, nontender, nondistended, + BS  No hepatomegaly  MS: no deformity Moving all extremities   Skin: warm and dry, no rash Neuro:  Strength and sensation are intact Psych: euthymic mood, full affect   EKG:  EKG is not  ordered today.   Lipid Panel    Component Value  Date/Time   CHOL 129 09/10/2014 0738   TRIG 146.0 09/10/2014 0738   HDL 29.80* 09/10/2014 0738   CHOLHDL 4 09/10/2014 0738   VLDL 29.2 09/10/2014 0738   LDLCALC 70 09/10/2014 0738      Wt Readings from Last 3 Encounters:  11/22/14 238 lb 9.6 oz (108.228 kg)  07/05/14 237 lb (107.502 kg)  02/22/14 224 lb (101.606 kg)      ASSESSMENT AND PLAN:  1.  CAD  Dong good  WIll need to check on length for Brilinta  2.  HL  Keep on current reigmen   LAst LDL was 57      F/U in January    Signed, Kymia Simi, MD  11/22/2014 8:44 AM    [address], [place], [place]  [address] Phone: [phone]; Fax: [phone]

## 2014-11-26 ENCOUNTER — Telehealth: Payer: Self-pay | Admitting: Internal Medicine

## 2014-11-27 NOTE — Telephone Encounter (Signed)
Erroneous entry

## 2014-12-02 ENCOUNTER — Encounter: Payer: Self-pay | Admitting: Internal Medicine

## 2014-12-02 MED ORDER — ASPIRIN EC 81 MG PO TBEC: 81.0000 mg | DELAYED_RELEASE_TABLET | Freq: Every day | ORAL | Status: DC

## 2014-12-02 MED ORDER — CLOPIDOGREL BISULFATE 75 MG PO TABS: 75.0000 mg | ORAL_TABLET | Freq: Every day | ORAL | Status: DC

## 2014-12-02 NOTE — Telephone Encounter (Signed)
Per Dr Karmen Stabs plavix 75mg  daily instead of Brilinta, continue aspirin 81mg  daily. Pt advised by telephone.

## 2014-12-20 NOTE — Telephone Encounter (Signed)
Late entry: This is Dr Harrington Challenger note that was copied and pasted into the chart in regards to this pt's medication.   Discussed with interventional cardiologists Would recomm that he continue on another agent like Brilinta. WIll be less expensive Plavix 75 mg 1x per day Will cntinue for a couple years Will follow blood counts periodically during that time as he has had until now.     COntinue on aspirin 81 mg per day

## 2015-01-26 ENCOUNTER — Other Ambulatory Visit: Payer: Self-pay | Admitting: Internal Medicine

## 2015-02-25 ENCOUNTER — Other Ambulatory Visit: Payer: Self-pay | Admitting: Internal Medicine

## 2015-06-17 ENCOUNTER — Other Ambulatory Visit: Payer: Self-pay | Admitting: Internal Medicine

## 2015-06-26 IMAGING — DX DG CHEST 1V PORT
1 series · 1 of 1 positions shown · non-contrast
Comparison: None.

CLINICAL DATA: Chest pain.  Decreased heart rate.  Sweats.

EXAM:
PORTABLE CHEST - 1 VIEW

[portable]
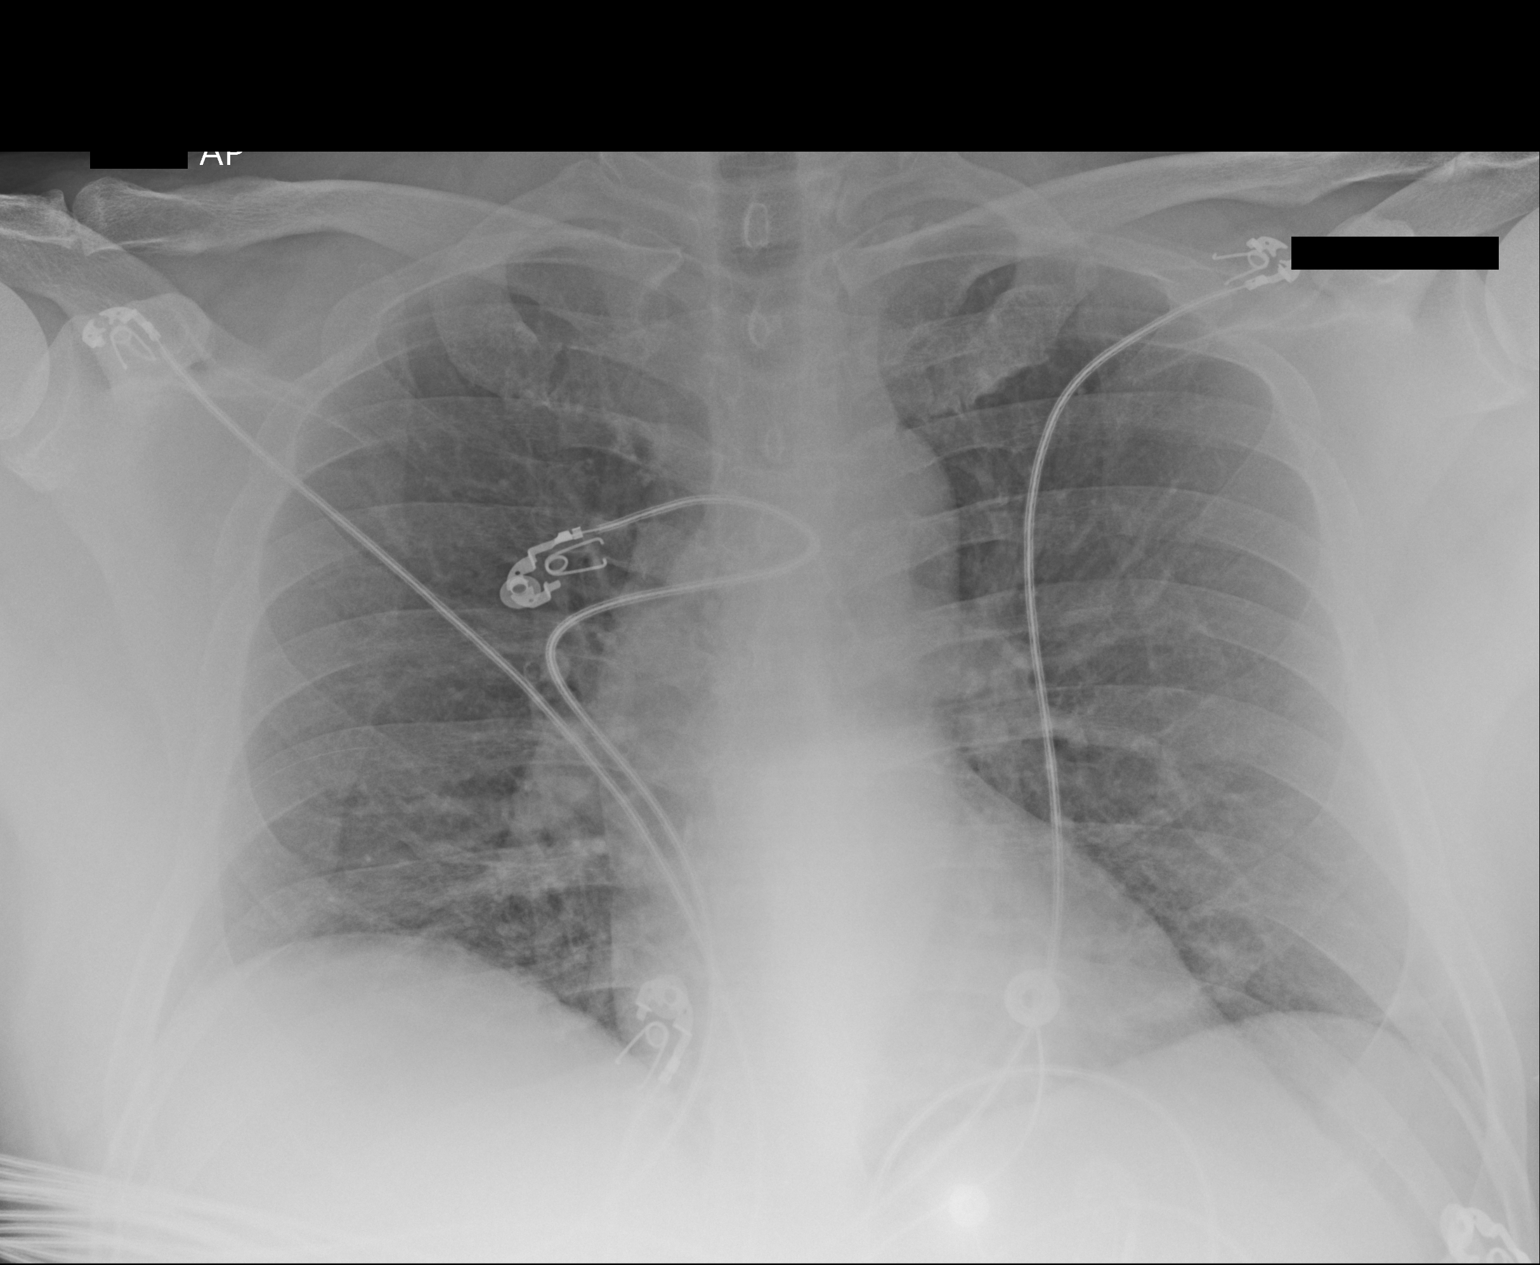

[1 of 1 positions shown; findings below may reference images not displayed]

FINDINGS: Shallow inspiration. The heart size and mediastinal contours are
within normal limits. Both lungs are clear. The visualized skeletal
structures are unremarkable.
IMPRESSION: No active disease.

## 2015-07-10 ENCOUNTER — Other Ambulatory Visit: Payer: Self-pay | Admitting: Internal Medicine

## 2015-08-22 ENCOUNTER — Other Ambulatory Visit: Payer: Self-pay | Admitting: Internal Medicine

## 2015-08-25 ENCOUNTER — Ambulatory Visit (INDEPENDENT_AMBULATORY_CARE_PROVIDER_SITE_OTHER): Payer: BLUE CROSS/BLUE SHIELD | Admitting: Internal Medicine

## 2015-08-25 ENCOUNTER — Encounter: Payer: Self-pay | Admitting: Internal Medicine

## 2015-08-25 VITALS — BP 122/78 | HR 68 | Ht 70.0 in | Wt 249.8 lb

## 2015-08-25 DIAGNOSIS — I2109 ST elevation (STEMI) myocardial infarction involving other coronary artery of anterior wall: Secondary | ICD-10-CM

## 2015-08-25 DIAGNOSIS — Z0000 Encounter for general adult medical examination without abnormal findings: Secondary | ICD-10-CM | POA: Diagnosis not present

## 2015-08-25 LAB — CBC
HCT: 48 % (ref 39.0–52.0)
Hemoglobin: 17.2 g/dL — ABNORMAL HIGH (ref 13.0–17.0)
MCH: 33.9 pg (ref 26.0–34.0)
MCHC: 35.8 g/dL (ref 30.0–36.0)
MCV: 94.7 fL (ref 78.0–100.0)
MPV: 9.8 fL (ref 8.6–12.4)
Platelets: 190 10*3/uL (ref 150–400)
RBC: 5.07 MIL/uL (ref 4.22–5.81)
RDW: 12.4 % (ref 11.5–15.5)
WBC: 6 10*3/uL (ref 4.0–10.5)

## 2015-08-25 LAB — BASIC METABOLIC PANEL
BUN: 14 mg/dL (ref 7–25)
CHLORIDE: 104 mmol/L (ref 98–110)
CO2: 26 mmol/L (ref 20–31)
Calcium: 9 mg/dL (ref 8.6–10.3)
Creat: 1.03 mg/dL (ref 0.70–1.33)
Glucose, Bld: 108 mg/dL — ABNORMAL HIGH (ref 65–99)
POTASSIUM: 4.6 mmol/L (ref 3.5–5.3)
Sodium: 137 mmol/L (ref 135–146)

## 2015-08-25 LAB — TSH: TSH: 1.03 m[IU]/L (ref 0.40–4.50)

## 2015-08-25 LAB — LIPID PANEL
Cholesterol: 142 mg/dL (ref 125–200)
HDL: 25 mg/dL — AB (ref >=40)
LDL CALC: 82 mg/dL (ref <130)
TRIGLYCERIDES: 177 mg/dL — AB (ref <150)
Total CHOL/HDL Ratio: 5.7 Ratio — ABNORMAL HIGH (ref <=5.0)
VLDL: 35 mg/dL — AB (ref <30)

## 2015-08-25 MED ORDER — NITROGLYCERIN 0.4 MG SL SUBL: 0.4000 mg | SUBLINGUAL_TABLET | SUBLINGUAL | Status: DC | PRN

## 2015-08-25 NOTE — Patient Instructions (Signed)
Your physician recommends that you continue on your current medications as directed. Please refer to the Current Medication list given to you today. Your physician recommends that you return for lab work TODAY (CBC, BMET, LIPIDS, TSH, PSA)  Your physician wants you to follow-up in: April, 2018 [place].   You will receive a reminder letter in the mail two months in advance. If you don't receive a letter, please call our office to schedule the follow-up appointment.

## 2015-08-25 NOTE — Progress Notes (Signed)
Cardiology Office Note   Date:  08/25/2015   ID:  Jonathan Lindsey, DOB 09-22-1960, MRN [id number]  PCP:  Wyatt Haste, MD  Cardiologist:   Dorris Carnes, MD   F/U of CAD     History of Present Illness: Jonathan Lindsey is a 55 y.o. male with a history of CAD that dates to April 2015. He had just had an inguinal hernal repair. Went home Did not feel good Heart burn with meals Attrib to pain meds. Monday after surgery he felt like elephant was sitting on chest. Went to ER EKG showed minimal ST elevation In IV1 to V3. Underewent emergent LHC Found to have 100% LAD Underewent PCI with Xienc Alpine DES to prox LAD Also had 50 to 60^ diag lesion LVEF was 45 to 50% He was sent home on ASA/Brilinta/Coreg and Altace Echo in Aug 2015 showed normalization of LVEF   Since he was last in clinic he has done well  No Cp  No SOB  Active        Current Outpatient Prescriptions  Medication Sig Dispense Refill  . aspirin EC 81 MG tablet Take 1 tablet (81 mg total) by mouth daily. 30 tablet 9  . atorvastatin (LIPITOR) 40 MG tablet TAKE 1 TABLET BY MOUTH EVERY DAY 90 tablet 2  . carvedilol (COREG) 6.25 MG tablet TAKE 1 TABLET BY MOUTH TWICE DAILY WITH A MEAL 60 tablet 2  . clopidogrel (PLAVIX) 75 MG tablet Take 1 tablet (75 mg total) by mouth daily. 30 tablet 9  . Multiple Vitamins-Minerals (EYE VITAMINS PO) Take 1 tablet by mouth daily.    . nitroGLYCERIN (NITROSTAT) 0.4 MG SL tablet Place 1 tablet (0.4 mg total) under the tongue every 5 (five) minutes x 3 doses as needed for chest pain. 25 tablet 12  . Omega-3 Fatty Acids (FISH OIL) 1000 MG CAPS Take 1 capsule by mouth daily.    . ramipril (ALTACE) 2.5 MG capsule TAKE ONE CAPSULE EVERY DAY 30 capsule 5   No current facility-administered medications for this visit.    Allergies:   Review of patient's allergies indicates no known allergies.   Past Medical History  Diagnosis Date  . Myocardial infarction [facility]) 2015     Past Surgical History  Procedure Laterality Date  . Hernia repair  1990    NOT SURE ??rih WITH MESH  . Appendectomy    . Eye surgery  2003    EYE DETACHMENT RETINA AND BUCKLES  . Left heart catheterization with coronary angiogram N/A 11/05/2013    Procedure: LEFT HEART CATHETERIZATION WITH CORONARY ANGIOGRAM;  Surgeon: Clent Demark, MD;  Location: [facility] CATH LAB;  Service: Cardiovascular;  Laterality: N/A;     Social History:  The patient  reports that he has never smoked. He has never used smokeless tobacco. He reports that he drinks alcohol. He reports that he does not use illicit drugs.   Family History:  The patient's family history includes Cancer in his father; Diabetes in his mother; Hyperlipidemia in his mother; Hypertension in his mother.    ROS:  Please see the history of present illness. All other systems are reviewed and  Negative to the above problem except as noted.    PHYSICAL EXAM: VS:  BP 122/78 mmHg  Pulse 68  Ht 5\' 10"  (1.778 m)  Wt 249 lb 12.8 oz (113.309 kg)  BMI 35.84 kg/m2  GEN: Well nourished, well developed, in no acute distress HEENT: normal Neck: no JVD, carotid bruits,  or masses Cardiac: RRR; no murmurs, rubs, or gallops,no edema  Respiratory:  clear to auscultation bilaterally, normal work of breathing GI: soft, nontender, nondistended, + BS  No hepatomegaly  MS: no deformity Moving all extremities   Skin: warm and dry, no rash Neuro:  Strength and sensation are intact Psych: euthymic mood, full affect   EKG:  EKG is ordered today.  NSR  69 bpm     Lipid Panel    Component Value Date/Time   CHOL 129 09/10/2014 0738   TRIG 146.0 09/10/2014 0738   HDL 29.80* 09/10/2014 0738   CHOLHDL 4 09/10/2014 0738   VLDL 29.2 09/10/2014 0738   LDLCALC 70 09/10/2014 0738      Wt Readings from Last 3 Encounters:  08/25/15 249 lb 12.8 oz (113.309 kg)  11/22/14 238 lb 9.6 oz (108.228 kg)  07/05/14 237 lb (107.502 kg)      ASSESSMENT AND  PLAN:  1  CAD   NO symptosm of angina  Will check on continued Plavix  Check labs  2  HL  Get lipids   3.  HCM  Check PSA and TSH  F?U in 1 year       Signed, Dorris Carnes, MD  08/25/2015 8:34 AM    [address], [place], [place]  [address] Phone: [phone]; Fax: [phone]

## 2015-08-26 LAB — PSA: PSA: 1.61 ng/mL (ref <=4.00)

## 2015-09-10 ENCOUNTER — Other Ambulatory Visit: Payer: Self-pay | Admitting: Internal Medicine

## 2015-11-16 ENCOUNTER — Other Ambulatory Visit: Payer: Self-pay | Admitting: Internal Medicine

## 2015-12-16 ENCOUNTER — Telehealth: Payer: Self-pay | Admitting: Internal Medicine

## 2015-12-16 NOTE — Telephone Encounter (Signed)
[name] is calling because he is having nose bleeds,(had 3 today) and on Saturday . He is currently taking Plavix 75 mg and Aspirin 81 mg .  Wants to know can he stop taking them or what should he do . Please call   Thanks

## 2015-12-16 NOTE — Telephone Encounter (Signed)
Pt states he had a nose bleed on Saturday and 3 nose bleeds today. Pt states he had to go to Urgent Care and have nose packed to get the nose bleed to stop. Pt denies any changes in diet or medications. Pt denies any URI symptoms, no sneezing or blowing his nose recently. Pt denies any other signs/symptoms of bleeding except some more frequent than usual bruising about a week ago. Pt had DES April 2015. Pt states he has already taken aspirin and plavix today. Pt advised I will forward to Dr Harrington Challenger for review and recommendations about plavix and aspirin.

## 2015-12-16 NOTE — Telephone Encounter (Signed)
Reviewed with Margaret Pyle:  Per Scott-hold Plavix for 5 days.  Pt advised, verbalized understanding.  Pt states he removed packing from his nose and he has not had a recurrent nose bleed.

## 2016-02-02 ENCOUNTER — Other Ambulatory Visit: Payer: Self-pay

## 2016-02-02 MED ORDER — RAMIPRIL 2.5 MG PO CAPS: 2.5000 mg | ORAL_CAPSULE | Freq: Every day | ORAL | Status: DC

## 2016-02-02 NOTE — Telephone Encounter (Signed)
Fay Records, MD at 08/25/2015 8:34 AM   ramipril (ALTACE) 2.5 MG capsule TAKE ONE CAPSULE EVERY DAY         Patient Instructions     Your physician recommends that you continue on your current medications as directed. Please refer to the Current Medication list given to you today.

## 2016-03-18 ENCOUNTER — Ambulatory Visit (INDEPENDENT_AMBULATORY_CARE_PROVIDER_SITE_OTHER): Payer: BLUE CROSS/BLUE SHIELD | Admitting: Family Medicine

## 2016-03-18 ENCOUNTER — Encounter: Payer: Self-pay | Admitting: Family Medicine

## 2016-03-18 VITALS — BP 116/76 | HR 79 | Temp 98.0°F | Wt 244.0 lb

## 2016-03-18 DIAGNOSIS — L723 Sebaceous cyst: Secondary | ICD-10-CM | POA: Diagnosis not present

## 2016-03-18 DIAGNOSIS — E669 Obesity, unspecified: Secondary | ICD-10-CM | POA: Diagnosis not present

## 2016-03-18 DIAGNOSIS — J0120 Acute ethmoidal sinusitis, unspecified: Secondary | ICD-10-CM

## 2016-03-18 DIAGNOSIS — I2510 Atherosclerotic heart disease of native coronary artery without angina pectoris: Secondary | ICD-10-CM | POA: Diagnosis not present

## 2016-03-18 MED ORDER — AMOXICILLIN-POT CLAVULANATE 875-125 MG PO TABS: 1.0000 | ORAL_TABLET | Freq: Two times a day (BID) | ORAL | 0 refills | Status: DC

## 2016-03-18 NOTE — Progress Notes (Signed)
   Subjective:    Patient ID: Jonathan Lindsey, male    DOB: 1961/03/07, 55 y.o.   MRN: [id number]  HPI He complains of a five-day history started with nasal congestion, rhinorrhea, sneezing, itchy watery eyes, PND. PND is now become yellow. He also complains of increased frontal sinus pressure. No upper tooth discomfort, or chills. He also has a lesion on his forehead that has been growing slightly. He also has a history of heart disease. He did go through cardiac rehabilitation but since and has not maintain an exercise program.   Review of Systems     Objective:   Physical Exam Alert and in no distress. 2 cm round smooth cystic lesion is noted on the mid forehead area. Tympanic membranes and canals are normal. Pharyngeal area is normal. Neck is supple without adenopathy or thyromegaly. Cardiac exam shows a regular sinus rhythm without murmurs or gallops. Lungs are clear to auscultation. He is a mucosa is red with tenderness over frontal sinuses.       Assessment & Plan:  Acute ethmoidal sinusitis, recurrence not specified - Plan: amoxicillin-clavulanate (AUGMENTIN) 875-125 MG tablet  Coronary artery disease involving native coronary artery of native heart without angina pectoris  Obesity (BMI 30-39.9) The pain is frontal as well as ethmoid. I will give him Augmentin for this. Also discussed at length the need for him to maintain a healthy lifestyle specially regard to physical activity. Strongly encouraged him to get involved in at least 20 minutes of something physical on a daily basis. Recommend excision of the sebaceous cyst when he is ready.

## 2016-03-24 ENCOUNTER — Other Ambulatory Visit: Payer: Self-pay | Admitting: Internal Medicine

## 2016-07-25 ENCOUNTER — Other Ambulatory Visit: Payer: Self-pay | Admitting: Internal Medicine

## 2016-09-23 ENCOUNTER — Other Ambulatory Visit: Payer: Self-pay | Admitting: Internal Medicine

## 2016-09-25 ENCOUNTER — Other Ambulatory Visit: Payer: Self-pay | Admitting: Internal Medicine

## 2016-10-13 ENCOUNTER — Encounter: Payer: Self-pay | Admitting: Internal Medicine

## 2016-10-25 ENCOUNTER — Encounter: Payer: Self-pay | Admitting: Internal Medicine

## 2016-10-25 ENCOUNTER — Ambulatory Visit (INDEPENDENT_AMBULATORY_CARE_PROVIDER_SITE_OTHER): Payer: BLUE CROSS/BLUE SHIELD | Admitting: Internal Medicine

## 2016-10-25 VITALS — BP 140/84 | HR 58 | Ht 70.0 in | Wt 236.8 lb

## 2016-10-25 DIAGNOSIS — M25552 Pain in left hip: Secondary | ICD-10-CM | POA: Diagnosis not present

## 2016-10-25 DIAGNOSIS — I2510 Atherosclerotic heart disease of native coronary artery without angina pectoris: Secondary | ICD-10-CM

## 2016-10-25 NOTE — Progress Notes (Signed)
Cardiology Office Note   Date:  10/25/2016   ID:  Reed Pandy Yokley, DOB 1961/05/28, MRN [id number]  PCP:  Wyatt Haste, MD  Cardiologist:   Dorris Carnes, MD   F/U OF CAD    History of Present Illness: Jonathan Lindsey is a 56 y.o. male with a history of CAD that dates to April 2015. He had just had an inguinal hernal repair. Went home Did not feel good Heart burn with meals Attrib to pain meds. Monday after surgery he felt like elephant was sitting on chest. Went to ER EKG showed minimal ST elevation In IV1 to V3. Underewent emergent LHC Found to have 100% LAD Underewent PCI with Xienc Alpine DES to prox LAD Also had 50 to 60^ diag lesion LVEF was 45 to 50% He was sent home on ASA/Brilinta/Coreg and Altace  Echo in Aug 2015 showed normalization of LVEF  I saw hiim in cilnic 1 year ago   Since seen he denies CP  Breathinng is OK        Current Meds  Medication Sig  . atorvastatin (LIPITOR) 40 MG tablet TAKE 1 TABLET BY MOUTH EVERY DAY  . carvedilol (COREG) 6.25 MG tablet TAKE 1 TABLET BY MOUTH TWICE DAILY WITH A MEAL  . clopidogrel (PLAVIX) 75 MG tablet TAKE 1 TABLET BY MOUTH EVERY DAY  . [employer] ASPIRIN LOW DOSE 81 MG EC tablet TAKE 1 TABLET BY MOUTH EVERY DAY  . Multiple Vitamins-Minerals (EYE VITAMINS PO) Take 1 tablet by mouth daily.  . nitroGLYCERIN (NITROSTAT) 0.4 MG SL tablet Place 1 tablet (0.4 mg total) under the tongue every 5 (five) minutes x 3 doses as needed for chest pain.  . Omega-3 Fatty Acids (FISH OIL) 1000 MG CAPS Take 1 capsule by mouth daily.  . ramipril (ALTACE) 2.5 MG capsule TAKE 1 CAPSULE (2.5 MG TOTAL) BY MOUTH DAILY.     Allergies:   Patient has no known allergies.   Past Medical History:  Diagnosis Date  . Myocardial infarction 2015    Past Surgical History:  Procedure Laterality Date  . APPENDECTOMY    . EYE SURGERY  2003   EYE DETACHMENT RETINA AND BUCKLES  . HERNIA REPAIR  1990   NOT SURE ??rih WITH MESH  . LEFT  HEART CATHETERIZATION WITH CORONARY ANGIOGRAM N/A 11/05/2013   Procedure: LEFT HEART CATHETERIZATION WITH CORONARY ANGIOGRAM;  Surgeon: Clent Demark, MD;  Location: [facility] CATH LAB;  Service: Cardiovascular;  Laterality: N/A;     Social History:  The patient  reports that he has never smoked. He has never used smokeless tobacco. He reports that he drinks alcohol. He reports that he does not use drugs.   Family History:  The patient's family history includes Cancer in his father; Diabetes in his mother; Hyperlipidemia in his mother; Hypertension in his mother.    ROS:  Please see the history of present illness. All other systems are reviewed and  Negative to the above problem except as noted.    PHYSICAL EXAM: VS:  BP 140/84   Pulse (!) 58   Ht 5\' 10"  (1.778 m)   Wt 236 lb 12.8 oz (107.4 kg)   BMI 33.98 kg/m   GEN: Well nourished, well developed, in no acute distress  HEENT: normal  Neck: no JVD, carotid bruits, or masses Cardiac: RRR; no murmurs, rubs, or gallops,no edema  Respiratory:  clear to auscultation bilaterally, normal work of breathing GI: soft, nontender, nondistended, + BS  No hepatomegaly  MS: no deformity Moving all extremities   Skin: warm and dry, no rash Neuro:  Strength and sensation are intact Psych: euthymic mood, full affect   EKG:  EKG is ordered today.  SB  58 bpm  First degree AV block  PR 210 msec   Lipid Panel    Component Value Date/Time   CHOL 142 08/25/2015 0902   TRIG 177 (H) 08/25/2015 0902   HDL 25 (L) 08/25/2015 0902   CHOLHDL 5.7 (H) 08/25/2015 0902   VLDL 35 (H) 08/25/2015 0902   LDLCALC 82 08/25/2015 0902      Wt Readings from Last 3 Encounters:  10/25/16 236 lb 12.8 oz (107.4 kg)  03/18/16 244 lb (110.7 kg)  08/25/15 249 lb 12.8 oz (113.3 kg)      ASSESSMENT AND PLAN:  1  CAD  No symptoms to sugg angina    2  HL  Check lpids    Will check BMET and CBC   Encouraged him to increase activity  He is having some L sided pain   WIll refer fo Z Tamala Julian WOuld be good for him to be ablve to get his wt down  Discussed diet.     Current medicines are reviewed at length with the patient today.  The patient does not have concerns regarding medicines.  Signed, Dorris Carnes, MD  10/25/2016 4:18 PM    [facility] [address], [place], [place]  [address] Phone: [phone]; Fax: [phone]

## 2016-10-25 NOTE — Patient Instructions (Signed)
Your physician recommends that you continue on your current medications as directed. Please refer to the Current Medication list given to you today. Your physician recommends that you return for lab work today (BMET, LIPIDS, CBC) You have been referred to Hulan Saas, MD, Sports Medicine, [place]. Your physician wants you to follow-up in: 1 year with Dr. Harrington Challenger.  You will receive a reminder letter in the mail two months in advance. If you don't receive a letter, please call our office to schedule the follow-up appointment.

## 2016-10-26 ENCOUNTER — Other Ambulatory Visit: Payer: Self-pay | Admitting: Internal Medicine

## 2016-10-26 LAB — LIPID PANEL
CHOL/HDL RATIO: 4.6 ratio (ref 0.0–5.0)
Cholesterol, Total: 143 mg/dL (ref 100–199)
HDL: 31 mg/dL — ABNORMAL LOW (ref >39)
LDL CALC: 80 mg/dL (ref 0–99)
TRIGLYCERIDES: 161 mg/dL — AB (ref 0–149)
VLDL Cholesterol Cal: 32 mg/dL (ref 5–40)

## 2016-10-26 LAB — CBC
HEMATOCRIT: 46.1 % (ref 37.5–51.0)
HEMOGLOBIN: 15.9 g/dL (ref 13.0–17.7)
MCH: 33 pg (ref 26.6–33.0)
MCHC: 34.5 g/dL (ref 31.5–35.7)
MCV: 96 fL (ref 79–97)
Platelets: 178 10*3/uL (ref 150–379)
RBC: 4.82 x10E6/uL (ref 4.14–5.80)
RDW: 12.7 % (ref 12.3–15.4)
WBC: 6.3 10*3/uL (ref 3.4–10.8)

## 2016-10-26 LAB — BASIC METABOLIC PANEL
BUN / CREAT RATIO: 16 (ref 9–20)
BUN: 21 mg/dL (ref 6–24)
CO2: 25 mmol/L (ref 18–29)
Calcium: 9.1 mg/dL (ref 8.7–10.2)
Chloride: 101 mmol/L (ref 96–106)
Creatinine, Ser: 1.31 mg/dL — ABNORMAL HIGH (ref 0.76–1.27)
GFR, EST AFRICAN AMERICAN: 70 mL/min/{1.73_m2} (ref >59)
GFR, EST NON AFRICAN AMERICAN: 60 mL/min/{1.73_m2} (ref >59)
GLUCOSE: 96 mg/dL (ref 65–99)
Potassium: 4.5 mmol/L (ref 3.5–5.2)
Sodium: 140 mmol/L (ref 134–144)

## 2016-10-28 ENCOUNTER — Other Ambulatory Visit: Payer: Self-pay | Admitting: *Deleted

## 2016-10-28 DIAGNOSIS — I2510 Atherosclerotic heart disease of native coronary artery without angina pectoris: Secondary | ICD-10-CM

## 2016-11-20 ENCOUNTER — Other Ambulatory Visit: Payer: Self-pay | Admitting: Internal Medicine

## 2016-11-30 ENCOUNTER — Other Ambulatory Visit: Payer: BLUE CROSS/BLUE SHIELD | Admitting: *Deleted

## 2016-11-30 DIAGNOSIS — I2510 Atherosclerotic heart disease of native coronary artery without angina pectoris: Secondary | ICD-10-CM

## 2016-11-30 LAB — BASIC METABOLIC PANEL
BUN/Creatinine Ratio: 13 (ref 9–20)
BUN: 13 mg/dL (ref 6–24)
CALCIUM: 8.7 mg/dL (ref 8.7–10.2)
CHLORIDE: 99 mmol/L (ref 96–106)
CO2: 24 mmol/L (ref 18–29)
Creatinine, Ser: 1.03 mg/dL (ref 0.76–1.27)
GFR calc non Af Amer: 81 mL/min/{1.73_m2} (ref >59)
GFR, EST AFRICAN AMERICAN: 93 mL/min/{1.73_m2} (ref >59)
Glucose: 115 mg/dL — ABNORMAL HIGH (ref 65–99)
Potassium: 4.4 mmol/L (ref 3.5–5.2)
Sodium: 137 mmol/L (ref 134–144)

## 2016-12-25 ENCOUNTER — Other Ambulatory Visit: Payer: Self-pay | Admitting: Internal Medicine

## 2017-01-12 ENCOUNTER — Other Ambulatory Visit: Payer: Self-pay | Admitting: Internal Medicine

## 2017-02-08 ENCOUNTER — Other Ambulatory Visit: Payer: Self-pay | Admitting: Internal Medicine

## 2017-02-09 ENCOUNTER — Other Ambulatory Visit: Payer: Self-pay

## 2017-02-09 MED ORDER — CARVEDILOL 6.25 MG PO TABS: ORAL_TABLET | ORAL | 3 refills | Status: DC

## 2017-02-11 MED ORDER — CARVEDILOL 6.25 MG PO TABS: ORAL_TABLET | ORAL | 2 refills | Status: DC

## 2017-02-11 NOTE — Addendum Note (Signed)
Addended by: Derl Barrow on: 02/11/2017 08:28 AM   Modules accepted: Orders

## 2017-02-11 NOTE — Telephone Encounter (Signed)
Rx was resent in, because 1st Rx had wrong quantity. Confirmation received.

## 2017-03-02 ENCOUNTER — Telehealth: Payer: Self-pay | Admitting: *Deleted

## 2017-03-02 NOTE — Telephone Encounter (Signed)
Notes recorded by Rodman Key, RN on 10/28/2016 at 5:54 PM EDT Released to [employer]. Also spoke with patient. He has 3 mo suupply of Lipitor he is going to to take before switching to Crestor 40 mg. Lab appointment made to repeat BMET in 1 month. ------  Notes recorded by Fay Records, MD on 10/27/2016 at 7:36 AM EDT CBC normal Cr up mildly  At 1.3  I would follow Stay hydrated Repeat BMET when comes in for lipids Lipids  LDL needs to be lower Currently 80 I would recomm trial of switching to Crestor 40 mg  F/U lipids in 8 wks after starting (can finish lipitor he has ) We did discuss diet in clinic

## 2017-03-02 NOTE — Telephone Encounter (Signed)
Left message for patient to call back  

## 2017-04-12 NOTE — Telephone Encounter (Signed)
I called and left message for patient to call back. He should be switched to rosuvastatin from Lipitor. Dr. Harrington Challenger told him to finish out his bottle of 90 days.  Then the pharmacy requested refills of lipitor and we refilled it.   When he calls back, we need to switch to Crestor 40 mg and plan for lipid panel 8 weeks later.

## 2017-06-07 ENCOUNTER — Encounter: Payer: Self-pay | Admitting: Family Medicine

## 2017-06-07 ENCOUNTER — Ambulatory Visit: Payer: BLUE CROSS/BLUE SHIELD | Admitting: Family Medicine

## 2017-06-07 VITALS — BP 130/70 | HR 78 | Temp 98.1°F | Resp 18 | Wt 242.8 lb

## 2017-06-07 DIAGNOSIS — Z23 Encounter for immunization: Secondary | ICD-10-CM

## 2017-06-07 DIAGNOSIS — J029 Acute pharyngitis, unspecified: Secondary | ICD-10-CM

## 2017-06-07 LAB — POCT RAPID STREP A (OFFICE): Rapid Strep A Screen: NEGATIVE

## 2017-06-07 NOTE — Progress Notes (Signed)
   Subjective:    Patient ID: Jonathan Lindsey, male    DOB: [birth year], 56 y.o.   MRN: [id number]  HPI He complains of a slight sore throat, sneezing, nasal congestion with coughing but no fever, chills, earache.  He has been taking Tylenol for this.  He presently is on Plavix  and does have a previous history of MI.   Review of Systems     Objective:   Physical Exam Strep screen is negativeAlert and in no distress. Tympanic membranes and canals are normal. Pharyngeal area is normal. Neck is supple without adenopathy or thyromegaly. Cardiac exam shows a regular sinus rhythm without murmurs or gallops. Lungs are clear to auscultation  Strep screen is negative     Assessment & Plan:  Acute pharyngitis, unspecified etiology - Plan: Rapid Strep A  Need for vaccination against Streptococcus pneumoniae - Plan: Pneumococcal conjugate vaccine 13-valent Recommend continued supportive care for his symptoms.  Did talk about the use of Pennsaid but only for short periods of time explained that that could increase his risk of bleeding.

## 2017-08-22 ENCOUNTER — Telehealth: Payer: Self-pay | Admitting: Internal Medicine

## 2017-08-22 DIAGNOSIS — E782 Mixed hyperlipidemia: Secondary | ICD-10-CM

## 2017-08-22 MED ORDER — ROSUVASTATIN CALCIUM 40 MG PO TABS: 40.0000 mg | ORAL_TABLET | Freq: Every day | ORAL | 3 refills | Status: DC

## 2017-08-22 NOTE — Telephone Encounter (Signed)
Previously patient was informed to switch from atorvastatin to rosuvastatin 40 mg.  However, he had a 90 day supply and then his pharmacy requested a refill which we supplied.  So now he is ready to start rosuvastatin.   Appt with PR on 4/4.  Coming for FLP on 10/18/17.

## 2017-08-22 NOTE — Telephone Encounter (Signed)
Pt c/o medication issue:  1. Name of Medication: atorvastatin   2. How are you currently taking this medication (dosage and times per day)? 40 mg, 1 xdaily   3. Are you having a reaction (difficulty breathing--STAT)? No   4. What is your medication issue? Patient would like to see if he should continue the medication, states that was a previous discussion to change this medication

## 2017-09-21 ENCOUNTER — Other Ambulatory Visit: Payer: Self-pay | Admitting: Internal Medicine

## 2017-10-18 ENCOUNTER — Other Ambulatory Visit: Payer: BLUE CROSS/BLUE SHIELD

## 2017-10-18 DIAGNOSIS — E782 Mixed hyperlipidemia: Secondary | ICD-10-CM | POA: Diagnosis not present

## 2017-10-18 LAB — LIPID PANEL
CHOLESTEROL TOTAL: 116 mg/dL (ref 100–199)
Chol/HDL Ratio: 4.1 ratio (ref 0.0–5.0)
HDL: 28 mg/dL — ABNORMAL LOW (ref >39)
LDL Calculated: 65 mg/dL (ref 0–99)
Triglycerides: 117 mg/dL (ref 0–149)
VLDL Cholesterol Cal: 23 mg/dL (ref 5–40)

## 2017-10-20 ENCOUNTER — Encounter: Payer: Self-pay | Admitting: Internal Medicine

## 2017-10-20 ENCOUNTER — Ambulatory Visit: Payer: BLUE CROSS/BLUE SHIELD | Admitting: Internal Medicine

## 2017-10-20 VITALS — BP 130/82 | HR 77 | Ht 71.0 in | Wt 239.4 lb

## 2017-10-20 DIAGNOSIS — E782 Mixed hyperlipidemia: Secondary | ICD-10-CM

## 2017-10-20 DIAGNOSIS — I2510 Atherosclerotic heart disease of native coronary artery without angina pectoris: Secondary | ICD-10-CM | POA: Diagnosis not present

## 2017-10-20 NOTE — Patient Instructions (Signed)
Your physician recommends that you continue on your current medications as directed. Please refer to the Current Medication list given to you today.  Your physician recommends that you return for lab work TODAY (cbc, bmet)  Your physician wants you to follow-up in: 1 year with Dr. Harrington Challenger.  You will receive a reminder letter in the mail two months in advance. If you don't receive a letter, please call our office to schedule the follow-up appointment.

## 2017-10-20 NOTE — Progress Notes (Signed)
Cardiology Office Note   Date:  10/20/2017   ID:  Jonathan Lindsey, DOB 1960-10-15, MRN [id number]  PCP:  Denita Lung, MD  Cardiologist:   Dorris Carnes, MD   F/U OF CAD    History of Present Illness: Jonathan Lindsey is a 57 y.o. male with a history ofCAD that dates to April 2015 Underewent emergent LHC Found to have 100% LAD Underewent PCI with Xience Alpine DES to prox LAD Also had 50 to 60^ diag lesion LVEF was 45 to 50% He was sent home on ASA/Brilinta/Coreg and Altace  Echo in Aug 2015 showed normalization of LVEF  I saw hiim in cilnic 1 year ago     Since seen he denies CP  Breathinng is OK  He is active  Currently finishing renovation on home           Current Meds  Medication Sig  . carvedilol (COREG) 6.25 MG tablet TAKE 1 TABLET BY MOUTH TWICE DAILY WITH A MEAL  . clopidogrel (PLAVIX) 75 MG tablet TAKE 1 TABLET BY MOUTH EVERY DAY  . [facility]S ASPIRIN LOW DOSE 81 MG EC tablet TAKE 1 TABLET BY MOUTH EVERY DAY  . Multiple Vitamins-Minerals (EYE VITAMINS PO) Take 1 tablet by mouth daily.  . nitroGLYCERIN (NITROSTAT) 0.4 MG SL tablet Place 1 tablet (0.4 mg total) under the tongue every 5 (five) minutes x 3 doses as needed for chest pain.  . Omega-3 Fatty Acids (FISH OIL) 1000 MG CAPS Take 1 capsule by mouth daily.  . ramipril (ALTACE) 2.5 MG capsule TAKE 1 CAPSULE (2.5 MG TOTAL) BY MOUTH DAILY.  . rosuvastatin (CRESTOR) 40 MG tablet Take 1 tablet (40 mg total) by mouth daily.     Allergies:   Patient has no known allergies.   Past Medical History:  Diagnosis Date  . Myocardial infarction [facility]) 2015    Past Surgical History:  Procedure Laterality Date  . APPENDECTOMY    . EYE SURGERY  2003   EYE DETACHMENT RETINA AND BUCKLES  . HERNIA REPAIR  1990   NOT SURE ??rih WITH MESH  . LEFT HEART CATHETERIZATION WITH CORONARY ANGIOGRAM N/A 11/05/2013   Procedure: LEFT HEART CATHETERIZATION WITH CORONARY ANGIOGRAM;  Surgeon: Clent Demark, MD;  Location: [facility] CATH  LAB;  Service: Cardiovascular;  Laterality: N/A;     Social History:  The patient  reports that he has never smoked. He has never used smokeless tobacco. He reports that he drinks alcohol. He reports that he does not use drugs.   Family History:  The patient's family history includes Cancer in his father; Diabetes in his mother; Hyperlipidemia in his mother; Hypertension in his mother.    ROS:  Please see the history of present illness. All other systems are reviewed and  Negative to the above problem except as noted.    PHYSICAL EXAM: VS:  BP 130/82   Pulse 77   Ht 5\' 11"  (1.803 m)   Wt 239 lb 6.4 oz (108.6 kg)   SpO2 96%   BMI 33.39 kg/m   GEN: Obese 57 yo, in no acute distress  HEENT: normal  Neck: no JVD, carotid bruits, or masses Cardiac: RRR; no murmurs, rubs, or gallops,no edema  Respiratory:  clear to auscultation bilaterally, normal work of breathing GI: soft, nontender, nondistended, + BS  No hepatomegaly  MS: no deformity Moving all extremities   Skin: warm and dry, no rash Neuro:  Strength and sensation are intact Psych: euthymic mood, full  affect   EKG:  EKG is ordered today. SR 70 bpm     Lipid Panel    Component Value Date/Time   CHOL 116 10/18/2017 0802   TRIG 117 10/18/2017 0802   HDL 28 (L) 10/18/2017 0802   CHOLHDL 4.1 10/18/2017 0802   CHOLHDL 5.7 (H) 08/25/2015 0902   VLDL 35 (H) 08/25/2015 0902   LDLCALC 65 10/18/2017 0802      Wt Readings from Last 3 Encounters:  10/20/17 239 lb 6.4 oz (108.6 kg)  06/07/17 242 lb 12.8 oz (110.1 kg)  10/25/16 236 lb 12.8 oz (107.4 kg)      ASSESSMENT AND PLAN:  1  CAD  Active   Asymptomatic   Follow     Will review use of Plavix    2  HL  Continue statin  LDL is goood at 65     Check CBC and BMET     F/U in 1 year     Current medicines are reviewed at length with the patient today.  The patient does not have concerns regarding medicines.  Signed, Dorris Carnes, MD  10/20/2017 4:28 PM    [address], [place],   [address] Phone: [phone]; Fax: [phone]

## 2017-10-21 LAB — BASIC METABOLIC PANEL
BUN/Creatinine Ratio: 13 (ref 9–20)
BUN: 13 mg/dL (ref 6–24)
CALCIUM: 9.3 mg/dL (ref 8.7–10.2)
CO2: 25 mmol/L (ref 20–29)
CREATININE: 1.02 mg/dL (ref 0.76–1.27)
Chloride: 104 mmol/L (ref 96–106)
GFR calc Af Amer: 94 mL/min/{1.73_m2} (ref >59)
GFR calc non Af Amer: 81 mL/min/{1.73_m2} (ref >59)
GLUCOSE: 94 mg/dL (ref 65–99)
Potassium: 5 mmol/L (ref 3.5–5.2)
Sodium: 142 mmol/L (ref 134–144)

## 2017-10-21 LAB — CBC
HEMOGLOBIN: 16.3 g/dL (ref 13.0–17.7)
Hematocrit: 47.3 % (ref 37.5–51.0)
MCH: 32.9 pg (ref 26.6–33.0)
MCHC: 34.5 g/dL (ref 31.5–35.7)
MCV: 95 fL (ref 79–97)
Platelets: 182 10*3/uL (ref 150–379)
RBC: 4.96 x10E6/uL (ref 4.14–5.80)
RDW: 12.7 % (ref 12.3–15.4)
WBC: 6.8 10*3/uL (ref 3.4–10.8)

## 2017-11-21 ENCOUNTER — Other Ambulatory Visit: Payer: Self-pay | Admitting: Internal Medicine

## 2017-12-25 ENCOUNTER — Other Ambulatory Visit: Payer: Self-pay | Admitting: Internal Medicine

## 2018-03-21 ENCOUNTER — Other Ambulatory Visit: Payer: Self-pay | Admitting: Internal Medicine

## 2018-03-27 ENCOUNTER — Other Ambulatory Visit: Payer: Self-pay

## 2018-03-27 ENCOUNTER — Inpatient Hospital Stay (HOSPITAL_COMMUNITY)
Admission: EM | Admit: 2018-03-27 | Discharge: 2018-03-28 | DRG: 287 | Disposition: A | Discharge status: Home / Self Care | Payer: BLUE CROSS/BLUE SHIELD | Attending: Internal Medicine | Admitting: Internal Medicine

## 2018-03-27 ENCOUNTER — Emergency Department (HOSPITAL_COMMUNITY): Payer: BLUE CROSS/BLUE SHIELD

## 2018-03-27 ENCOUNTER — Encounter (HOSPITAL_COMMUNITY): Payer: Self-pay | Admitting: Emergency Medicine

## 2018-03-27 ENCOUNTER — Telehealth: Payer: Self-pay | Admitting: Internal Medicine

## 2018-03-27 DIAGNOSIS — Z7902 Long term (current) use of antithrombotics/antiplatelets: Secondary | ICD-10-CM | POA: Diagnosis not present

## 2018-03-27 DIAGNOSIS — I25110 Atherosclerotic heart disease of native coronary artery with unstable angina pectoris: Secondary | ICD-10-CM | POA: Diagnosis not present

## 2018-03-27 DIAGNOSIS — I493 Ventricular premature depolarization: Secondary | ICD-10-CM | POA: Diagnosis present

## 2018-03-27 DIAGNOSIS — R079 Chest pain, unspecified: Secondary | ICD-10-CM | POA: Diagnosis not present

## 2018-03-27 DIAGNOSIS — Z6833 Body mass index (BMI) 33.0-33.9, adult: Secondary | ICD-10-CM

## 2018-03-27 DIAGNOSIS — I10 Essential (primary) hypertension: Secondary | ICD-10-CM | POA: Diagnosis present

## 2018-03-27 DIAGNOSIS — K219 Gastro-esophageal reflux disease without esophagitis: Secondary | ICD-10-CM | POA: Diagnosis not present

## 2018-03-27 DIAGNOSIS — E669 Obesity, unspecified: Secondary | ICD-10-CM | POA: Diagnosis not present

## 2018-03-27 DIAGNOSIS — R0789 Other chest pain: Secondary | ICD-10-CM | POA: Diagnosis present

## 2018-03-27 DIAGNOSIS — E785 Hyperlipidemia, unspecified: Secondary | ICD-10-CM | POA: Diagnosis present

## 2018-03-27 DIAGNOSIS — I200 Unstable angina: Secondary | ICD-10-CM | POA: Diagnosis not present

## 2018-03-27 DIAGNOSIS — Z79899 Other long term (current) drug therapy: Secondary | ICD-10-CM | POA: Diagnosis not present

## 2018-03-27 DIAGNOSIS — Z7982 Long term (current) use of aspirin: Secondary | ICD-10-CM | POA: Diagnosis not present

## 2018-03-27 DIAGNOSIS — I252 Old myocardial infarction: Secondary | ICD-10-CM | POA: Diagnosis not present

## 2018-03-27 DIAGNOSIS — R0602 Shortness of breath: Secondary | ICD-10-CM | POA: Diagnosis not present

## 2018-03-27 HISTORY — DX: Congenital malformation of eye, unspecified: Q15.9

## 2018-03-27 LAB — I-STAT TROPONIN, ED
TROPONIN I, POC: 0.02 ng/mL (ref 0.00–0.08)
Troponin i, poc: 0.02 ng/mL (ref 0.00–0.08)

## 2018-03-27 LAB — BASIC METABOLIC PANEL
Anion gap: 11 (ref 5–15)
BUN: 16 mg/dL (ref 6–20)
CO2: 24 mmol/L (ref 22–32)
CREATININE: 0.93 mg/dL (ref 0.61–1.24)
Calcium: 10 mg/dL (ref 8.9–10.3)
Chloride: 103 mmol/L (ref 98–111)
GFR calc Af Amer: >60 mL/min (ref >60)
GLUCOSE: 146 mg/dL — AB (ref 70–99)
Potassium: 3.9 mmol/L (ref 3.5–5.1)
SODIUM: 138 mmol/L (ref 135–145)

## 2018-03-27 LAB — CBC
HEMATOCRIT: 48.6 % (ref 39.0–52.0)
HEMOGLOBIN: 17 g/dL (ref 13.0–17.0)
MCH: 33 pg (ref 26.0–34.0)
MCHC: 35 g/dL (ref 30.0–36.0)
MCV: 94.4 fL (ref 78.0–100.0)
Platelets: 174 10*3/uL (ref 150–400)
RBC: 5.15 MIL/uL (ref 4.22–5.81)
RDW: 11.3 % — ABNORMAL LOW (ref 11.5–15.5)
WBC: 6.3 10*3/uL (ref 4.0–10.5)

## 2018-03-27 NOTE — Telephone Encounter (Signed)
New message:    FYI:  Pt's wife states the husband is on his way to the ER because he is feeling the same way he felt when he had his first blockage.

## 2018-03-27 NOTE — Telephone Encounter (Signed)
Spoke with pt's wife who states her husband was having CP. Currently they are arriving to the ED for further evaluation and they wanted Dr Harrington Challenger to be aware. Will forward to Dr Harrington Challenger and her RN for review.

## 2018-03-27 NOTE — ED Provider Notes (Signed)
[facility] EMERGENCY DEPARTMENT Provider Note   CSN: [id number] Arrival date & time: 03/27/18  1430     History   Chief Complaint Chief Complaint  Patient presents with  . Chest Pain    HPI Jonathan Lindsey is a 57 y.o. male.  57 yo M with a chief complaint of chest pain.  This happened when the patient has eaten his last few meals.  Feels like when he is had his heart attack in the past.  States that last time he had a heart attack it was right after a extremity surgery he started having pressure in his chest every time that he ate.  Thinks that this feels the same.  Denies exertional symptoms.  Denies diaphoresis.  Some shortness of breath with it.  Resolved with rest and nitroglycerin.  The patient is currently pain-free.  The history is provided by the patient.  Chest Pain   This is a new problem. The current episode started 6 to 12 hours ago. The problem occurs constantly. The problem has been resolved. The pain is associated with eating. The pain is present in the substernal region and lateral region. The pain is at a severity of 8/10. The pain is severe. The quality of the pain is described as heavy and pressure-like. The pain does not radiate. Duration of episode(s) is 5 hours. Associated symptoms include shortness of breath. Pertinent negatives include no abdominal pain, no fever, no headaches, no palpitations and no vomiting. He has tried nothing for the symptoms. The treatment provided no relief.  His past medical history is significant for MI.    Past Medical History:  Diagnosis Date  . Eye abnormalities    left eye - no lens  . Myocardial infarction [facility]) 2015    Patient Active Problem List   Diagnosis Date Noted  . Unstable angina ([place]) 03/28/2018  . Chest pain with high risk for cardiac etiology   . Gastroesophageal reflux disease   . CAD (coronary artery disease), native coronary artery 11/05/2013  . Inguinal hernia, left 10/03/2013  .  Obesity (BMI 30-39.9) 09/14/2013  . Anisocoria 09/14/2013  . Allergic rhinitis due to allergen 09/14/2013    Past Surgical History:  Procedure Laterality Date  . APPENDECTOMY    . EYE SURGERY  2003   EYE DETACHMENT RETINA AND BUCKLES  . HERNIA REPAIR  1990   NOT SURE ??rih WITH MESH  . LEFT HEART CATHETERIZATION WITH CORONARY ANGIOGRAM N/A 11/05/2013   Procedure: LEFT HEART CATHETERIZATION WITH CORONARY ANGIOGRAM;  Surgeon: Clent Demark, MD;  Location: [facility] CATH LAB;  Service: Cardiovascular;  Laterality: N/A;        Home Medications    Prior to Admission medications   Medication Sig Start Date End Date Taking? Authorizing Provider  acetaminophen (TYLENOL) 325 MG tablet Take 325-650 mg by mouth every 6 (six) hours as needed (for pain or headaches).   Yes [provider]  carvedilol (COREG) 6.25 MG tablet TAKE 1 TABLET BY MOUTH TWICE DAILY WITH A MEAL Patient taking differently: Take by mouth 2 (two) times daily with a meal.  03/21/18  Yes Fay Records, MD  clopidogrel (PLAVIX) 75 MG tablet TAKE 1 TABLET BY MOUTH EVERY DAY 12/26/17  Yes Fay Records, MD  [facility] ASPIRIN LOW DOSE 81 MG EC tablet TAKE 1 TABLET BY MOUTH EVERY DAY 10/26/16  Yes Fay Records, MD  diphenhydrAMINE (BENADRYL) 25 mg capsule Take 25 mg by mouth every 6 (six) hours  as needed (for any potential allergic reactions).   Yes [provider]  Multiple Vitamins-Minerals (EYE VITAMINS PO) Take 1 tablet by mouth daily.   Yes [provider]  nitroGLYCERIN (NITROSTAT) 0.4 MG SL tablet Place 1 tablet (0.4 mg total) under the tongue every 5 (five) minutes x 3 doses as needed for chest pain. 08/25/15  Yes Fay Records, MD  Omega-3 Fatty Acids (FISH OIL) 1000 MG CAPS Take 1,000 mg by mouth daily.    Yes [provider]  ramipril (ALTACE) 2.5 MG capsule TAKE 1 CAPSULE (2.5 MG TOTAL) BY MOUTH DAILY. 11/21/17  Yes Fay Records, MD  rosuvastatin (CRESTOR) 40 MG tablet Take 1 tablet (40 mg total) by  mouth daily. 08/22/17  Yes Fay Records, MD    Family History Family History  Problem Relation Age of Onset  . Diabetes Mother   . Hyperlipidemia Mother   . Hypertension Mother   . Cancer Father        MULTIPLE MELANOMAS    Social History Social History   Tobacco Use  . Smoking status: Never Smoker  . Smokeless tobacco: Never Used  Substance Use Topics  . Alcohol use: Yes    Comment: rare  . Drug use: No     Allergies   Patient has no known allergies.   Review of Systems Review of Systems  Constitutional: Negative for chills and fever.  HENT: Negative for congestion and facial swelling.   Eyes: Negative for discharge and visual disturbance.  Respiratory: Positive for chest tightness and shortness of breath.   Cardiovascular: Negative for chest pain and palpitations.  Gastrointestinal: Negative for abdominal pain, diarrhea and vomiting.  Musculoskeletal: Negative for arthralgias and myalgias.  Skin: Negative for color change and rash.  Neurological: Negative for tremors, syncope and headaches.  Psychiatric/Behavioral: Negative for confusion and dysphoric mood.     Physical Exam Updated Vital Signs BP 125/82   Pulse 69   Temp 98.4 F (36.9 C) (Oral)   Resp 11   Ht 5\' 10"  (1.778 m)   Wt 104.3 kg   SpO2 97%   BMI 33.00 kg/m   Physical Exam  Constitutional: He is oriented to person, place, and time. He appears well-developed and well-nourished.  HENT:  Head: Normocephalic and atraumatic.  Eyes: Pupils are equal, round, and reactive to light. EOM are normal.  Neck: Normal range of motion. Neck supple. No JVD present.  Cardiovascular: Normal rate and regular rhythm. Exam reveals no gallop and no friction rub.  No murmur heard. Pulmonary/Chest: No respiratory distress. He has no wheezes.  Abdominal: He exhibits no distension. There is no rebound and no guarding.  Musculoskeletal: Normal range of motion.  Neurological: He is alert and oriented to person,  place, and time.  Skin: No rash noted. No pallor.  Psychiatric: He has a normal mood and affect. His behavior is normal.  Nursing note and vitals reviewed.    ED Treatments / Results  Labs (all labs ordered are listed, but only abnormal results are displayed) Labs Reviewed  BASIC METABOLIC PANEL - Abnormal; Notable for the following components:      Result Value   Glucose, Bld 146 (*)    All other components within normal limits  CBC - Abnormal; Notable for the following components:   RDW 11.3 (*)    All other components within normal limits  BASIC METABOLIC PANEL - Abnormal; Notable for the following components:   Glucose, Bld 160 (*)  Calcium 8.7 (*)    All other components within normal limits  HEPARIN LEVEL (UNFRACTIONATED) - Abnormal; Notable for the following components:   Heparin Unfractionated 0.10 (*)    All other components within normal limits  CBC  HIV ANTIBODY (ROUTINE TESTING)  I-STAT TROPONIN, ED  I-STAT TROPONIN, ED    EKG EKG Interpretation  Date/Time:  Monday March 27 2018 22:19:35 EDT Ventricular Rate:  61 PR Interval:  218 QRS Duration: 112 QT Interval:  428 QTC Calculation: 430 R Axis:   47 Text Interpretation:  Sinus rhythm with 1st degree A-V block Otherwise normal ECG possible elevation in II, III, aVF resolved Otherwise no significant change Confirmed by Deno Etienne [phone]) on 03/27/2018 10:44:11 PM   Radiology Dg Chest 2 View  Result Date: 03/27/2018 CLINICAL DATA:  Left-sided chest pain for 1 day EXAM: CHEST - 2 VIEW COMPARISON:  11/05/2013 FINDINGS: The heart size and mediastinal contours are within normal limits. Both lungs are clear. The visualized skeletal structures are unremarkable. IMPRESSION: No active cardiopulmonary disease. Electronically Signed   By: Inez Catalina M.D.   On: 03/27/2018 15:09    Procedures Procedures (including critical care time)  Medications Ordered in ED Medications  aspirin EC tablet 81 mg ( Oral  Automatically Held 04/06/18 1000)  nitroGLYCERIN (NITROSTAT) SL tablet 0.4 mg ( Sublingual MAR Hold 03/28/18 0945)  acetaminophen (TYLENOL) tablet 650 mg ( Oral MAR Hold 03/28/18 0945)  ondansetron (ZOFRAN) injection 4 mg ( Intravenous MAR Hold 03/28/18 0945)  clopidogrel (PLAVIX) tablet 75 mg ( Oral Automatically Held 04/05/18 0800)  carvedilol (COREG) tablet 6.25 mg ( Oral Automatically Held 04/05/18 1700)  ramipril (ALTACE) capsule 2.5 mg ( Oral Automatically Held 04/05/18 1000)  rosuvastatin (CRESTOR) tablet 40 mg ( Oral Automatically Held 04/05/18 1000)  heparin ADULT infusion 100 units/mL (25000 units/272mL sodium chloride 0.45%) (0 Units/hr Intravenous Stopped 03/28/18 0952)  sodium chloride flush (NS) 0.9 % injection 3 mL (has no administration in time range)  sodium chloride flush (NS) 0.9 % injection 3 mL (has no administration in time range)  0.9 %  sodium chloride infusion (has no administration in time range)  0.9 %  sodium chloride infusion ( Intravenous New Bag/Given 03/28/18 0846)  aspirin chewable tablet 81 mg (has no administration in time range)  heparin bolus via infusion 4,000 Units (4,000 Units Intravenous Bolus from Bag 03/28/18 0053)     Initial Impression / Assessment and Plan / ED Course  I have reviewed the triage vital signs and the nursing notes.  Pertinent labs & imaging results that were available during my care of the patient were reviewed by me and considered in my medical decision making (see chart for details).     57 yo M with a chief complaint of chest pain.  This is typical of when he had his heart attack previously.  His initial EKG is concerning for possible ST elevation in the inferior leads.  This could be baseline wander as it seems to travel down to the right and all leads.  His troponin initially is negative.  I had an EKG repeated and the changes inferiorly had resolved.  I discussed the case with the cardiologist recommended a repeat troponin. Repeat also  negative which is reassuring, though with pain similar to prior stent placement and ecg changes, cards evaluated the patient at bedside, started on heparin and will admit.   CRITICAL CARE Performed by: Cecilio Asper   Total critical care time: 35 minutes  Critical care  time was exclusive of separately billable procedures and treating other patients.  Critical care was necessary to treat or prevent imminent or life-threatening deterioration.  Critical care was time spent personally by me on the following activities: development of treatment plan with patient and/or surrogate as well as nursing, discussions with consultants, evaluation of patient's response to treatment, examination of patient, obtaining history from patient or surrogate, ordering and performing treatments and interventions, ordering and review of laboratory studies, ordering and review of radiographic studies, pulse oximetry and re-evaluation of patient's condition.  The patients results and plan were reviewed and discussed.   Any x-rays performed were independently reviewed by myself.   Differential diagnosis were considered with the presenting HPI.  Medications  aspirin EC tablet 81 mg ( Oral Automatically Held 04/06/18 1000)  nitroGLYCERIN (NITROSTAT) SL tablet 0.4 mg ( Sublingual MAR Hold 03/28/18 0945)  acetaminophen (TYLENOL) tablet 650 mg ( Oral MAR Hold 03/28/18 0945)  ondansetron (ZOFRAN) injection 4 mg ( Intravenous MAR Hold 03/28/18 0945)  clopidogrel (PLAVIX) tablet 75 mg ( Oral Automatically Held 04/05/18 0800)  carvedilol (COREG) tablet 6.25 mg ( Oral Automatically Held 04/05/18 1700)  ramipril (ALTACE) capsule 2.5 mg ( Oral Automatically Held 04/05/18 1000)  rosuvastatin (CRESTOR) tablet 40 mg ( Oral Automatically Held 04/05/18 1000)  heparin ADULT infusion 100 units/mL (25000 units/2102mL sodium chloride 0.45%) (0 Units/hr Intravenous Stopped 03/28/18 0952)  sodium chloride flush (NS) 0.9 % injection 3 mL  (has no administration in time range)  sodium chloride flush (NS) 0.9 % injection 3 mL (has no administration in time range)  0.9 %  sodium chloride infusion (has no administration in time range)  0.9 %  sodium chloride infusion ( Intravenous New Bag/Given 03/28/18 0846)  aspirin chewable tablet 81 mg (has no administration in time range)  heparin bolus via infusion 4,000 Units (4,000 Units Intravenous Bolus from Bag 03/28/18 0053)    Vitals:   03/28/18 0822 03/28/18 0900 03/28/18 0958 03/28/18 1035  BP: 123/87 133/85  125/82  Pulse: 73 71  69  Resp: 11 16  11   Temp:      TempSrc:      SpO2: 97% 98% 99% 97%  Weight:      Height:        Final diagnoses:  Chest pain with high risk for cardiac etiology    Admission/ observation were discussed with the admitting physician, patient and/or family and they are comfortable with the plan.   Final Clinical Impressions(s) / ED Diagnoses   Final diagnoses:  Chest pain with high risk for cardiac etiology    ED Discharge Orders    None       Deno Etienne, DO 03/28/18 1133

## 2018-03-27 NOTE — ED Triage Notes (Signed)
Pt reports left sided CP that occurred after eating last night and today after lunch. Reports not sleeping well last night. Denies nausea/vomiting. Reports pain feels like heartburn, took a tums without relief. Denies nausea, vomiting, weakness, diaphoresis with the pain. VSS.

## 2018-03-28 ENCOUNTER — Other Ambulatory Visit: Payer: Self-pay

## 2018-03-28 ENCOUNTER — Encounter (HOSPITAL_COMMUNITY)
Admission: EM | Disposition: A | Discharge status: Home / Self Care | Payer: Self-pay | Source: Home / Self Care | Attending: Internal Medicine

## 2018-03-28 ENCOUNTER — Encounter (HOSPITAL_COMMUNITY): Payer: Self-pay | Admitting: Emergency Medicine

## 2018-03-28 DIAGNOSIS — I10 Essential (primary) hypertension: Secondary | ICD-10-CM | POA: Diagnosis present

## 2018-03-28 DIAGNOSIS — R079 Chest pain, unspecified: Secondary | ICD-10-CM

## 2018-03-28 DIAGNOSIS — I252 Old myocardial infarction: Secondary | ICD-10-CM | POA: Diagnosis not present

## 2018-03-28 DIAGNOSIS — Z7902 Long term (current) use of antithrombotics/antiplatelets: Secondary | ICD-10-CM | POA: Diagnosis not present

## 2018-03-28 DIAGNOSIS — Z7982 Long term (current) use of aspirin: Secondary | ICD-10-CM | POA: Diagnosis not present

## 2018-03-28 DIAGNOSIS — Z6833 Body mass index (BMI) 33.0-33.9, adult: Secondary | ICD-10-CM | POA: Diagnosis not present

## 2018-03-28 DIAGNOSIS — E669 Obesity, unspecified: Secondary | ICD-10-CM | POA: Diagnosis present

## 2018-03-28 DIAGNOSIS — I493 Ventricular premature depolarization: Secondary | ICD-10-CM | POA: Diagnosis present

## 2018-03-28 DIAGNOSIS — K219 Gastro-esophageal reflux disease without esophagitis: Secondary | ICD-10-CM | POA: Diagnosis present

## 2018-03-28 DIAGNOSIS — R0789 Other chest pain: Secondary | ICD-10-CM | POA: Diagnosis present

## 2018-03-28 DIAGNOSIS — I200 Unstable angina: Secondary | ICD-10-CM

## 2018-03-28 DIAGNOSIS — Z79899 Other long term (current) drug therapy: Secondary | ICD-10-CM | POA: Diagnosis not present

## 2018-03-28 DIAGNOSIS — I25110 Atherosclerotic heart disease of native coronary artery with unstable angina pectoris: Secondary | ICD-10-CM | POA: Diagnosis present

## 2018-03-28 DIAGNOSIS — E785 Hyperlipidemia, unspecified: Secondary | ICD-10-CM | POA: Diagnosis present

## 2018-03-28 HISTORY — PX: LEFT HEART CATH AND CORONARY ANGIOGRAPHY: CATH118249

## 2018-03-28 LAB — CBC
HEMATOCRIT: 45.1 % (ref 39.0–52.0)
Hemoglobin: 15.7 g/dL (ref 13.0–17.0)
MCH: 33.3 pg (ref 26.0–34.0)
MCHC: 34.8 g/dL (ref 30.0–36.0)
MCV: 95.6 fL (ref 78.0–100.0)
Platelets: 155 10*3/uL (ref 150–400)
RBC: 4.72 MIL/uL (ref 4.22–5.81)
RDW: 11.5 % (ref 11.5–15.5)
WBC: 6.1 10*3/uL (ref 4.0–10.5)

## 2018-03-28 LAB — BASIC METABOLIC PANEL
ANION GAP: 8 (ref 5–15)
BUN: 11 mg/dL (ref 6–20)
CO2: 24 mmol/L (ref 22–32)
Calcium: 8.7 mg/dL — ABNORMAL LOW (ref 8.9–10.3)
Chloride: 106 mmol/L (ref 98–111)
Creatinine, Ser: 0.98 mg/dL (ref 0.61–1.24)
GFR calc Af Amer: >60 mL/min (ref >60)
GFR calc non Af Amer: >60 mL/min (ref >60)
GLUCOSE: 160 mg/dL — AB (ref 70–99)
POTASSIUM: 3.7 mmol/L (ref 3.5–5.1)
Sodium: 138 mmol/L (ref 135–145)

## 2018-03-28 LAB — HEPARIN LEVEL (UNFRACTIONATED): Heparin Unfractionated: 0.1 IU/mL — ABNORMAL LOW (ref 0.30–0.70)

## 2018-03-28 LAB — HIV ANTIBODY (ROUTINE TESTING W REFLEX): HIV Screen 4th Generation wRfx: NONREACTIVE

## 2018-03-28 SURGERY — LEFT HEART CATH AND CORONARY ANGIOGRAPHY
Anesthesia: LOCAL

## 2018-03-28 MED ORDER — HEPARIN (PORCINE) IN NACL 1000-0.9 UT/500ML-% IV SOLN
INTRAVENOUS | Status: DC | PRN
  Administered 2018-03-28 (×2): 500 mL

## 2018-03-28 MED ORDER — RAMIPRIL 2.5 MG PO CAPS: 2.5000 mg | ORAL_CAPSULE | Freq: Every day | ORAL | Status: DC

## 2018-03-28 MED ORDER — ACETAMINOPHEN 325 MG PO TABS: 325.0000 mg | ORAL_TABLET | Freq: Four times a day (QID) | ORAL | Status: DC | PRN

## 2018-03-28 MED ORDER — ASPIRIN 81 MG PO CHEW
CHEWABLE_TABLET | ORAL | Status: DC | PRN
  Administered 2018-03-28: 81 mg via ORAL

## 2018-03-28 MED ORDER — IOHEXOL 350 MG/ML SOLN
INTRAVENOUS | Status: DC | PRN
  Administered 2018-03-28: 75 mL

## 2018-03-28 MED ORDER — SODIUM CHLORIDE 0.9% FLUSH: 3.0000 mL | Freq: Two times a day (BID) | INTRAVENOUS | Status: DC

## 2018-03-28 MED ORDER — LIDOCAINE HCL (PF) 1 % IJ SOLN
INTRAMUSCULAR | Status: DC | PRN
  Administered 2018-03-28: 2 mL via INTRADERMAL

## 2018-03-28 MED ORDER — ASPIRIN 81 MG PO CHEW
CHEWABLE_TABLET | ORAL | Status: AC
  Filled 2018-03-28: qty 1

## 2018-03-28 MED ORDER — ASPIRIN EC 81 MG PO TBEC: 81.0000 mg | DELAYED_RELEASE_TABLET | Freq: Every day | ORAL | Status: DC

## 2018-03-28 MED ORDER — ACETAMINOPHEN 325 MG PO TABS: 650.0000 mg | ORAL_TABLET | ORAL | Status: DC | PRN

## 2018-03-28 MED ORDER — HEPARIN BOLUS VIA INFUSION
4000.0000 [IU] | Freq: Once | INTRAVENOUS | Status: AC
  Administered 2018-03-28: 4000 [IU] via INTRAVENOUS
  Filled 2018-03-28: qty 4000

## 2018-03-28 MED ORDER — CARVEDILOL 6.25 MG PO TABS
6.2500 mg | ORAL_TABLET | Freq: Two times a day (BID) | ORAL | Status: DC
  Administered 2018-03-28 (×2): 6.25 mg via ORAL
  Filled 2018-03-28 (×2): qty 1

## 2018-03-28 MED ORDER — NITROGLYCERIN 1 MG/10 ML FOR IR/CATH LAB
INTRA_ARTERIAL | Status: DC | PRN
  Administered 2018-03-28: 200 ug via INTRACORONARY

## 2018-03-28 MED ORDER — HEPARIN (PORCINE) IN NACL 100-0.45 UNIT/ML-% IJ SOLN
1300.0000 [IU]/h | INTRAMUSCULAR | Status: DC
  Administered 2018-03-28 (×2): 1300 [IU]/h via INTRAVENOUS
  Filled 2018-03-28 (×2): qty 250

## 2018-03-28 MED ORDER — MIDAZOLAM HCL 2 MG/2ML IJ SOLN
INTRAMUSCULAR | Status: DC | PRN
  Administered 2018-03-28: 1 mg via INTRAVENOUS

## 2018-03-28 MED ORDER — FENTANYL CITRATE (PF) 100 MCG/2ML IJ SOLN
INTRAMUSCULAR | Status: DC | PRN
  Administered 2018-03-28: 25 ug via INTRAVENOUS

## 2018-03-28 MED ORDER — FAMOTIDINE 10 MG PO TABS: 10.0000 mg | ORAL_TABLET | Freq: Two times a day (BID) | ORAL | 1 refills | Status: AC

## 2018-03-28 MED ORDER — RAMIPRIL 2.5 MG PO CAPS
2.5000 mg | ORAL_CAPSULE | Freq: Every day | ORAL | Status: DC
  Filled 2018-03-28: qty 1

## 2018-03-28 MED ORDER — NITROGLYCERIN 0.4 MG SL SUBL: 0.4000 mg | SUBLINGUAL_TABLET | SUBLINGUAL | Status: DC | PRN

## 2018-03-28 MED ORDER — VERAPAMIL HCL 2.5 MG/ML IV SOLN
INTRAVENOUS | Status: DC | PRN
  Administered 2018-03-28: 10 mL via INTRA_ARTERIAL

## 2018-03-28 MED ORDER — VERAPAMIL HCL 2.5 MG/ML IV SOLN
INTRAVENOUS | Status: AC
  Filled 2018-03-28: qty 2

## 2018-03-28 MED ORDER — CARVEDILOL 12.5 MG PO TABS: 12.5000 mg | ORAL_TABLET | Freq: Two times a day (BID) | ORAL | 5 refills | Status: DC

## 2018-03-28 MED ORDER — SODIUM CHLORIDE 0.9 % IV SOLN: 250.0000 mL | INTRAVENOUS | Status: DC | PRN

## 2018-03-28 MED ORDER — HEPARIN SODIUM (PORCINE) 1000 UNIT/ML IJ SOLN
INTRAMUSCULAR | Status: DC | PRN
  Administered 2018-03-28: 5000 [IU] via INTRAVENOUS

## 2018-03-28 MED ORDER — ENOXAPARIN SODIUM 40 MG/0.4ML ~~LOC~~ SOLN: 40.0000 mg | SUBCUTANEOUS | Status: DC

## 2018-03-28 MED ORDER — ONDANSETRON HCL 4 MG/2ML IJ SOLN: 4.0000 mg | Freq: Four times a day (QID) | INTRAMUSCULAR | Status: DC | PRN

## 2018-03-28 MED ORDER — SODIUM CHLORIDE 0.9% FLUSH: 3.0000 mL | INTRAVENOUS | Status: DC | PRN

## 2018-03-28 MED ORDER — ASPIRIN 81 MG PO CHEW: 81.0000 mg | CHEWABLE_TABLET | ORAL | Status: DC

## 2018-03-28 MED ORDER — ROSUVASTATIN CALCIUM 40 MG PO TABS
40.0000 mg | ORAL_TABLET | Freq: Every day | ORAL | Status: DC
  Filled 2018-03-28: qty 1

## 2018-03-28 MED ORDER — HEPARIN SODIUM (PORCINE) 1000 UNIT/ML IJ SOLN
INTRAMUSCULAR | Status: AC
  Filled 2018-03-28: qty 1

## 2018-03-28 MED ORDER — ROSUVASTATIN CALCIUM 40 MG PO TABS: 40.0000 mg | ORAL_TABLET | Freq: Every day | ORAL | Status: DC

## 2018-03-28 MED ORDER — NITROGLYCERIN 1 MG/10 ML FOR IR/CATH LAB
INTRA_ARTERIAL | Status: AC
  Filled 2018-03-28: qty 10

## 2018-03-28 MED ORDER — LIDOCAINE HCL (PF) 1 % IJ SOLN
INTRAMUSCULAR | Status: AC
  Filled 2018-03-28: qty 30

## 2018-03-28 MED ORDER — CLOPIDOGREL BISULFATE 75 MG PO TABS
75.0000 mg | ORAL_TABLET | Freq: Every day | ORAL | Status: DC
  Administered 2018-03-28: 75 mg via ORAL
  Filled 2018-03-28: qty 1

## 2018-03-28 MED ORDER — CARVEDILOL 12.5 MG PO TABS: 12.5000 mg | ORAL_TABLET | Freq: Two times a day (BID) | ORAL | Status: DC

## 2018-03-28 MED ORDER — HEPARIN (PORCINE) IN NACL 1000-0.9 UT/500ML-% IV SOLN
INTRAVENOUS | Status: AC
  Filled 2018-03-28: qty 1000

## 2018-03-28 MED ORDER — SODIUM CHLORIDE 0.9 % WEIGHT BASED INFUSION: 1.0000 mL/kg/h | INTRAVENOUS | Status: DC

## 2018-03-28 MED ORDER — FENTANYL CITRATE (PF) 100 MCG/2ML IJ SOLN
INTRAMUSCULAR | Status: AC
  Filled 2018-03-28: qty 2

## 2018-03-28 MED ORDER — MIDAZOLAM HCL 2 MG/2ML IJ SOLN
INTRAMUSCULAR | Status: AC
  Filled 2018-03-28: qty 2

## 2018-03-28 MED ORDER — FAMOTIDINE 20 MG PO TABS: 10.0000 mg | ORAL_TABLET | Freq: Two times a day (BID) | ORAL | Status: DC

## 2018-03-28 MED ORDER — SODIUM CHLORIDE 0.9 % IV SOLN
INTRAVENOUS | Status: DC
  Administered 2018-03-28: 09:00:00 via INTRAVENOUS

## 2018-03-28 SURGICAL SUPPLY — 10 items

## 2018-03-28 NOTE — Interval H&P Note (Signed)
History and Physical Interval Note:  03/28/2018 9:50 AM  Jonathan Lindsey  has presented today for surgery, with the diagnosis of ua  The various methods of treatment have been discussed with the patient and family. After consideration of risks, benefits and other options for treatment, the patient has consented to  Procedure(s): LEFT HEART CATH AND CORONARY ANGIOGRAPHY (N/A) as a surgical intervention .  The patient's history has been reviewed, patient examined, no change in status, stable for surgery.  I have reviewed the patient's chart and labs.  Questions were answered to the patient's satisfaction.   Cath Lab Visit (complete for each Cath Lab visit)  Clinical Evaluation Leading to the Procedure:   ACS: Yes.    Non-ACS:    Anginal Classification: CCS IV  Anti-ischemic medical therapy: Minimal Therapy (1 class of medications)  Non-Invasive Test Results: No non-invasive testing performed  Prior CABG: No previous CABG       Jonathan Lindsey [facility] 03/28/2018 9:50 AM

## 2018-03-28 NOTE — Progress Notes (Signed)
ANTICOAGULATION CONSULT NOTE - Initial Consult  Pharmacy Consult for heparin Indication: chest pain/ACS  No Known Allergies  Patient Measurements: Height: 5\' 10"  (177.8 cm) Weight: 230 lb (104.3 kg) IBW/kg (Calculated) : 73 Heparin Dosing Weight: 95.2 kg  Vital Signs: Temp: 98.4 F (36.9 C) (09/09 1436) Temp Source: Oral (09/09 1436) BP: 125/74 (09/10 0006) Pulse Rate: 71 (09/10 0006)  Labs: Recent Labs    03/27/18 1440  HGB 17.0  HCT 48.6  PLT 174  CREATININE 0.93    Estimated Creatinine Clearance: 106 mL/min (by C-G formula based on SCr of 0.93 mg/dL).   Medical History: Past Medical History:  Diagnosis Date  . Myocardial infarction [facility]) 2015    Medications:  See medication history  Assessment: 57 yo man to start heparin for CP.  He was not on anticoagulation PTA.  Baseline labs okay. Goal of Therapy:  Heparin level 0.3-0.7 units/ml Monitor platelets by anticoagulation protocol: Yes   Plan:  Heparin 4000 unit bolus and drip at 1300 units/hr Check heparin level ~ 6 hours after start Daily HL and CBC while on heparin. Monitor for bleeding complications  Khadijah Mastrianni Poteet 03/28/2018,12:35 AM

## 2018-03-28 NOTE — H&P (Addendum)
Cardiology History & Physical    Patient ID: Jonathan Lindsey MRN: [id number], DOB: 10/03/1960 Date of Encounter: 03/28/2018, 12:14 AM Primary Physician: Denita Lung, MD Primary Cardiologist: Dorris Carnes Primary Electrophysiologist:  None  Chief Complaint: Epigastric pain Reason for Admission: Unstable angina Requesting MD: Deno Etienne, DO  HPI: Jonathan Lindsey is a 57 y.o. male with history of coronary artery disease (anterior STEMI 10/2013 status-post DES to proximal LAD), hypertension, hyperlipidemia, obesity who presents with worsening epigastric pain concerning for unstable angina. The patient developed indigestion like discomfort in the epigastric area with eating over starting yesterday. Of note, this is the exact same symptom and exacerbating factor that preceding his prior ST segment elevation MI. Yesterday he spent most of the day in the yard doing yard work and felt fine. Subsequently after eating lunch he developed the same epigastric discomfort prolonged lasting >1 hour. No accompanying arm pain, numbness/tingling, shortness of breath, diaphoresis, pre-syncope, palpitations. No recurrent symptoms since lunch today. He continues to have mild chest tightness now. 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 Early repolarization changes in the inferior leads. Denies any upcoming surgeries, blood in urine or stool, recent trauma.    Past Medical History:  Diagnosis Date  . Myocardial infarction [facility]) 2015     Surgical History:  Past Surgical History:  Procedure Laterality Date  . APPENDECTOMY    . EYE SURGERY  2003   EYE DETACHMENT RETINA AND BUCKLES  . HERNIA REPAIR  1990   NOT SURE ??rih WITH MESH  . LEFT HEART CATHETERIZATION WITH CORONARY ANGIOGRAM N/A 11/05/2013   Procedure: LEFT HEART CATHETERIZATION WITH CORONARY ANGIOGRAM;  Surgeon: Clent Demark, MD;  Location: [facility] CATH LAB;   Service: Cardiovascular;  Laterality: N/A;     Home Meds: Prior to Admission medications   Medication Sig Start Date End Date Taking? Authorizing Provider  acetaminophen (TYLENOL) 325 MG tablet Take 325-650 mg by mouth every 6 (six) hours as needed (for pain or headaches).   Yes [provider]  carvedilol (COREG) 6.25 MG tablet TAKE 1 TABLET BY MOUTH TWICE DAILY WITH A MEAL Patient taking differently: Take by mouth 2 (two) times daily with a meal.  03/21/18  Yes Fay Records, MD  clopidogrel (PLAVIX) 75 MG tablet TAKE 1 TABLET BY MOUTH EVERY DAY 12/26/17  Yes Fay Records, MD  [employer] ASPIRIN LOW DOSE 81 MG EC tablet TAKE 1 TABLET BY MOUTH EVERY DAY 10/26/16  Yes Fay Records, MD  diphenhydrAMINE (BENADRYL) 25 mg capsule Take 25 mg by mouth every 6 (six) hours as needed (for any potential allergic reactions).   Yes [provider]  Multiple Vitamins-Minerals (EYE VITAMINS PO) Take 1 tablet by mouth daily.   Yes [provider]  nitroGLYCERIN (NITROSTAT) 0.4 MG SL tablet Place 1 tablet (0.4 mg total) under the tongue every 5 (five) minutes x 3 doses as needed for chest pain. 08/25/15  Yes Fay Records, MD  Omega-3 Fatty Acids (FISH OIL) 1000 MG CAPS Take 1,000 mg by mouth daily.    Yes [provider]  ramipril (ALTACE) 2.5 MG capsule TAKE 1 CAPSULE (2.5 MG TOTAL) BY MOUTH DAILY. 11/21/17  Yes Fay Records, MD  rosuvastatin (CRESTOR) 40 MG tablet Take 1 tablet (40 mg total) by mouth daily. 08/22/17  Yes Fay Records, MD    Allergies: No  Known Allergies  Social history  The patient is an Optometrist and actively employed. He is accompanied by his wife. He does not smoke. He drinks 1-2 alcoholic beverages per week.   Family History  Problem Relation Age of Onset  . Diabetes Mother   . Hyperlipidemia Mother   . Hypertension Mother   . Cancer Father        MULTIPLE MELANOMAS    Review of Systems: General: negative for chills, fever, night sweats or weight  changes.  Cardiovascular: positive for chest pain, negative for edema, orthopnea, palpitations, paroxysmal nocturnal dyspnea, shortness of breath or dyspnea on exertion Dermatological: negative for rash Respiratory: negative for cough or wheezing Urologic: negative for hematuria Abdominal: negative for nausea, vomiting, diarrhea, bright red blood per rectum, melena, or hematemesis Neurologic: negative for visual changes, syncope, or dizziness All other systems reviewed and are otherwise negative except as noted above.  Labs:   Lab Results  Component Value Date   WBC 6.3 03/27/2018   HGB 17.0 03/27/2018   HCT 48.6 03/27/2018   MCV 94.4 03/27/2018   PLT 174 03/27/2018    Recent Labs  Lab 03/27/18 1440  NA 138  K 3.9  CL 103  CO2 24  BUN 16  CREATININE 0.93  CALCIUM 10.0  GLUCOSE 146*   No results for input(s): CKTOTAL, CKMB, TROPONINI in the last 72 hours. Lab Results  Component Value Date   CHOL 116 10/18/2017   HDL 28 (L) 10/18/2017   LDLCALC 65 10/18/2017   TRIG 117 10/18/2017   No results found for: DDIMER  Radiology/Studies:  Dg Chest 2 View  Result Date: 03/27/2018 CLINICAL DATA:  Left-sided chest pain for 1 day EXAM: CHEST - 2 VIEW COMPARISON:  11/05/2013 FINDINGS: The heart size and mediastinal contours are within normal limits. Both lungs are clear. The visualized skeletal structures are unremarkable. IMPRESSION: No active cardiopulmonary disease. Electronically Signed   By: Inez Catalina M.D.   On: 03/27/2018 15:09   Wt Readings from Last 3 Encounters:  03/27/18 104.3 kg  10/20/17 108.6 kg  06/07/17 110.1 kg    EKG: personally reviewed. Normal sinus rhythm, convex sub-millimeter inferior STE early repolarization changes   TTE 03/01/2014: personally reviewed. LVEF=60-65%. Grade 1 diastolic dysfunction. Mitral annular calcification without significant MR. Normal RV function. No TR.   11/06/2013 LHC report reviewed. 1. Left cardiac catheterization with  selective left and right coronary     angiography, left ventriculography via right groin using Judkins     technique. 2. Successful percutaneous transluminal coronary angioplasty to     proximal 100% occluded left anterior descending coronary artery     using 2.5 x 12 mm long Trek balloon. 3. Successful deployment of 3.5 x 23 mm long Xience Alpine drug-     eluting stent in proximal left anterior descending coronary artery. 4. Successful postdilatation of the stent using 3.5 x 15 mm long [place]     Emerge balloon.  Physical Exam Blood pressure 129/83, pulse 73, temperature 98.4 F (36.9 C), temperature source Oral, resp. rate 12, height 5\' 10"  (1.778 m), weight 104.3 kg, SpO2 97 %. Body mass index is 33 kg/m. General: Well developed, well nourished, in no acute distress. Head: Normocephalic, atraumatic, sclera non-icteric, no xanthomas, nares are without discharge.  Neck: Negative for carotid bruits. JVD not elevated. Lungs: Clear bilaterally to auscultation without wheezes, rales, or rhonchi. Breathing is unlabored. Heart: RRR with S1 S2. No murmurs, rubs, or gallops appreciated. Abdomen: Soft, non-tender, non-distended with  normoactive bowel sounds. No hepatomegaly. No rebound/guarding. No obvious abdominal masses. Msk:  Strength and tone appear normal for age. Extremities: No clubbing or cyanosis. No edema.  Distal pedal pulses are 2+ and equal bilaterally. Neuro: Alert and oriented X 3. No focal deficit. No facial asymmetry. Moves all extremities spontaneously. Psych:  Responds to questions appropriately with a normal affect.     Assessment and Plan  Jonathan Lindsey is a 57 y.o. male with history of coronary artery disease (anterior STEMI 10/2013 status-post DES to proximal LAD), hypertension, hyperlipidemia, obesity who presents with worsening epigastric pain concerning for unstable angina.  1. Unstable angina / acute coronary syndrome. He presents with his typical anginal  equivalent worsening over the past several days. This is concerning for ACS. Enzymology negative for myocardial injury but is high risk with proximal LAD stent. Will admit for consideration of invasive versus noninvasive evaluation.  - Heparin ACS protocol - Continue aspirin - Continue clopidogrel - Continue rosuvastatin - Continue carvedilol - Sublingual nitroglycerine PRN - NPO at MN for LHC vs. SPECT  2. Hypertension - Continue ramipril  - Continue carvedilol  Severity of Illness: The appropriate patient status for this patient is INPATIENT. Inpatient status is judged to be reasonable and necessary in order to provide the required intensity of service to ensure the patient's safety. The patient's presenting symptoms, physical exam findings, and initial radiographic and laboratory data in the context of their chronic comorbidities is felt to place them at high risk for further clinical deterioration. Furthermore, it is not anticipated that the patient will be medically stable for discharge from the hospital within 2 midnights of admission. The following factors support the patient status of inpatient.   " The patient's presenting symptoms include chest pain. " The worrisome physical exam findings include obesity. " The initial radiographic and laboratory data are worrisome because of abnormal EKG. " The chronic co-morbidities include history of coronary artery disease.   * I certify that at the point of admission it is my clinical judgment that the patient will require inpatient hospital care spanning beyond 2 midnights from the point of admission due to high intensity of service, high risk for further deterioration and high frequency of surveillance required.*    For questions or updates, please contact [place] Please consult [email or web address] for contact info under Cardiology/STEMI.  Signed, Perley Jain, MD 03/28/2018, 12:14 AM

## 2018-03-28 NOTE — Progress Notes (Signed)
Progress Note  Patient Name: Jonathan Lindsey Date of Encounter: 03/28/2018  Primary Cardiologist: Dorris Carnes, MD   Subjective   Still has mild substernal CP at rest. Feels like pressure. No dyspnea.   Inpatient Medications    Scheduled Meds: . [START ON 03/29/2018] aspirin EC  81 mg Oral Daily  . carvedilol  6.25 mg Oral BID WC  . clopidogrel  75 mg Oral Q breakfast  . ramipril  2.5 mg Oral Daily  . rosuvastatin  40 mg Oral Daily   Continuous Infusions: . heparin 1,300 Units/hr (03/28/18 0055)   PRN Meds: acetaminophen, nitroGLYCERIN, ondansetron (ZOFRAN) IV   Vital Signs    Vitals:   03/28/18 0300 03/28/18 0400 03/28/18 0500 03/28/18 0600  BP: 128/84 123/80 126/74 100/66  Pulse: 69 69 71 76  Resp: 10 13 12 18   Temp:      TempSrc:      SpO2: 97% 95% 97% 96%  Weight:      Height:       No intake or output data in the 24 hours ending 03/28/18 0809 Filed Weights   03/27/18 1439  Weight: 104.3 kg    Telemetry    NSR with frequent PVCs - Personally Reviewed  ECG    Early repol abnormalities in inferior leads - Personally Reviewed  Physical Exam   GEN: moderately obese, middle aged WM in No acute distress.   Neck: No JVD Cardiac: RRR, no murmurs, rubs, or gallops.  Respiratory: Clear to auscultation bilaterally. GI: Soft, nontender, non-distended  MS: No edema; No deformity. Neuro:  Nonfocal  Psych: Normal affect   Labs    Chemistry Recent Labs  Lab 03/27/18 1440 03/28/18 0354  NA 138 138  K 3.9 3.7  CL 103 106  CO2 24 24  GLUCOSE 146* 160*  BUN 16 11  CREATININE 0.93 0.98  CALCIUM 10.0 8.7*  GFRNONAA >60 >60  GFRAA >60 >60  ANIONGAP 11 8     Hematology Recent Labs  Lab 03/27/18 1440 03/28/18 0354  WBC 6.3 6.1  RBC 5.15 4.72  HGB 17.0 15.7  HCT 48.6 45.1  MCV 94.4 95.6  MCH 33.0 33.3  MCHC 35.0 34.8  RDW 11.3* 11.5  PLT 174 155    Cardiac EnzymesNo results for input(s): TROPONINI in the last 168 hours.  Recent Labs    Lab 03/27/18 1448 03/27/18 2310  TROPIPOC 0.02 0.02     BNPNo results for input(s): BNP, PROBNP in the last 168 hours.   DDimer No results for input(s): DDIMER in the last 168 hours.   Radiology    Dg Chest 2 View  Result Date: 03/27/2018 CLINICAL DATA:  Left-sided chest pain for 1 day EXAM: CHEST - 2 VIEW COMPARISON:  11/05/2013 FINDINGS: The heart size and mediastinal contours are within normal limits. Both lungs are clear. The visualized skeletal structures are unremarkable. IMPRESSION: No active cardiopulmonary disease. Electronically Signed   By: Inez Catalina M.D.   On: 03/27/2018 15:09    Cardiac Studies   LHC 10/2013 FINDINGS:  LV showed inferoapical wall dyskinesia, EF of 45% to 50%. Left main was patent.  LAD was 100% occluded after giving off moderate sized diagonal 2.  Diagonal 2 was large which has mild proximal stenosis.  Diagonal 2 has 40% to 50% ostial stenosis.  Ramus was large which was patent.  Left circumflex was small which was patent.  RCA was large which was patent.  INTERVENTIONAL PROCEDURE:  Successful PTCA 200% occluded.  Proximal LAD was done using 2.5 x 12 mm long Trek balloon for predilatation and then 3.5 x 23 mm long Xience Alpine drug-eluting stent was deployed at 10 atmospheric pressure and proximal LAD.  This stent was post dilated using 3.5 x 15 mm long [place] Emerge balloon, going up to 18 atmospheric pressure.  Lesion dilated from 100% to 0% residual with excellent TIMI grade 3 distal flow without evidence of dissection or distal embolization.  Patient Profile     57 y.o. moderately obese male with known CAD s/p acute anterolateral STEMI 10/2013, secondary to 100% occluded proximal LAD, treated with PTCA/DES placement and h/o HLD, presenting with anterior CP, reminiscent of his prior angina.   Assessment & Plan    1. Chest Pain with Moderate Risk for Cardiac Etiology: DD includes ACS vs GI related CP. Pt notes indigestion like CP, occurring  after eating, which is also the exact symptom pattern he experienced right before an acute anterolateral STEMI in 2015. The only difference is the absence of left sided arm pain with recent episodes. He has had little relief with OTC antacids (TUMS). His pain is atypical of cardiac CP in that he has had no exertional symptoms. Pt notes he was outside in his yard working 2 days ago w/o exertional CP and dyspnea. He has had 2 episodes of post prandial CP lasting ~45 min in the last week. Yesterday's episode lasted >1 hr and cardiac enzymes negative x 2. EKG shows early repolarization changes in the inferior leads.  Tele shows NSR but frequent PVCs, raising concern for possible coronary ischemia. He continues to have mild substernal chest pressure currently and is on IV heparin. BP is soft at 100/66, thus will avoid IV nitro for now. Keep NPO and plan for LHC to reassess patency of LAD and r/o obstructive disease in other territories. If cath is unremarkable for any culprit lesions to explain his symptoms, we will treat as GI CP and will start PPI.   I have reviewed the risks, indications, and alternatives to cardiac catheterization and possible angioplasty/stenting with the patient. Risks include but are not limited to bleeding, infection, vascular injury, stroke, myocardial infection, arrhythmia, kidney injury, radiation-related injury in the case of prolonged fluoroscopy use, emergency cardiac surgery, and death. The patient understands the risks of serious complication is low (<5%).   2. PVCs: frequent but asymptomatic. Continue BB therapy w/ Coreg. Plan ischemic evaluation/ LHC. If negative, would recommend outpatient 48 holter monitor to assess PVC burden. Check 2D Echo to assess LVEF. K is WNL. Check TSH.   3. HLD: on statin therapy w/ Crestor. Lipid panel 10/2017 showed controlled LDL <70 mg/dL at 65 mg/dL. Continue Crestor at current dose.   For questions or updates, please contact [place] Please consult [email or web address] for contact info under        Signed, Lyda Jester, PA-C  03/28/2018, 8:09 AM

## 2018-03-28 NOTE — H&P (View-Only) (Signed)
Progress Note  Patient Name: Jonathan Lindsey Date of Encounter: 03/28/2018  Primary Cardiologist: Dorris Carnes, MD   Subjective   Still has mild substernal CP at rest. Feels like pressure. No dyspnea.   Inpatient Medications    Scheduled Meds: . [START ON 03/29/2018] aspirin EC  81 mg Oral Daily  . carvedilol  6.25 mg Oral BID WC  . clopidogrel  75 mg Oral Q breakfast  . ramipril  2.5 mg Oral Daily  . rosuvastatin  40 mg Oral Daily   Continuous Infusions: . heparin 1,300 Units/hr (03/28/18 0055)   PRN Meds: acetaminophen, nitroGLYCERIN, ondansetron (ZOFRAN) IV   Vital Signs    Vitals:   03/28/18 0300 03/28/18 0400 03/28/18 0500 03/28/18 0600  BP: 128/84 123/80 126/74 100/66  Pulse: 69 69 71 76  Resp: 10 13 12 18   Temp:      TempSrc:      SpO2: 97% 95% 97% 96%  Weight:      Height:       No intake or output data in the 24 hours ending 03/28/18 0809 Filed Weights   03/27/18 1439  Weight: 104.3 kg    Telemetry    NSR with frequent PVCs - Personally Reviewed  ECG    Early repol abnormalities in inferior leads - Personally Reviewed  Physical Exam   GEN: moderately obese, middle aged WM in No acute distress.   Neck: No JVD Cardiac: RRR, no murmurs, rubs, or gallops.  Respiratory: Clear to auscultation bilaterally. GI: Soft, nontender, non-distended  MS: No edema; No deformity. Neuro:  Nonfocal  Psych: Normal affect   Labs    Chemistry Recent Labs  Lab 03/27/18 1440 03/28/18 0354  NA 138 138  K 3.9 3.7  CL 103 106  CO2 24 24  GLUCOSE 146* 160*  BUN 16 11  CREATININE 0.93 0.98  CALCIUM 10.0 8.7*  GFRNONAA >60 >60  GFRAA >60 >60  ANIONGAP 11 8     Hematology Recent Labs  Lab 03/27/18 1440 03/28/18 0354  WBC 6.3 6.1  RBC 5.15 4.72  HGB 17.0 15.7  HCT 48.6 45.1  MCV 94.4 95.6  MCH 33.0 33.3  MCHC 35.0 34.8  RDW 11.3* 11.5  PLT 174 155    Cardiac EnzymesNo results for input(s): TROPONINI in the last 168 hours.  Recent Labs    Lab 03/27/18 1448 03/27/18 2310  TROPIPOC 0.02 0.02     BNPNo results for input(s): BNP, PROBNP in the last 168 hours.   DDimer No results for input(s): DDIMER in the last 168 hours.   Radiology    Dg Chest 2 View  Result Date: 03/27/2018 CLINICAL DATA:  Left-sided chest pain for 1 day EXAM: CHEST - 2 VIEW COMPARISON:  11/05/2013 FINDINGS: The heart size and mediastinal contours are within normal limits. Both lungs are clear. The visualized skeletal structures are unremarkable. IMPRESSION: No active cardiopulmonary disease. Electronically Signed   By: Inez Catalina M.D.   On: 03/27/2018 15:09    Cardiac Studies   LHC 10/2013 FINDINGS:  LV showed inferoapical wall dyskinesia, EF of 45% to 50%. Left main was patent.  LAD was 100% occluded after giving off moderate sized diagonal 2.  Diagonal 2 was large which has mild proximal stenosis.  Diagonal 2 has 40% to 50% ostial stenosis.  Ramus was large which was patent.  Left circumflex was small which was patent.  RCA was large which was patent.  INTERVENTIONAL PROCEDURE:  Successful PTCA 200% occluded.  Proximal LAD was done using 2.5 x 12 mm long Trek balloon for predilatation and then 3.5 x 23 mm long Xience Alpine drug-eluting stent was deployed at 10 atmospheric pressure and proximal LAD.  This stent was post dilated using 3.5 x 15 mm long  Emerge balloon, going up to 18 atmospheric pressure.  Lesion dilated from 100% to 0% residual with excellent TIMI grade 3 distal flow without evidence of dissection or distal embolization.  Patient Profile     57 y.o. moderately obese male with known CAD s/p acute anterolateral STEMI 10/2013, secondary to 100% occluded proximal LAD, treated with PTCA/DES placement and h/o HLD, presenting with anterior CP, reminiscent of his prior angina.   Assessment & Plan    1. Chest Pain with Moderate Risk for Cardiac Etiology: DD includes ACS vs GI related CP. Pt notes indigestion like CP, occurring  after eating, which is also the exact symptom pattern he experienced right before an acute anterolateral STEMI in 2015. The only difference is the absence of left sided arm pain with recent episodes. He has had little relief with OTC antacids (TUMS). His pain is atypical of cardiac CP in that he has had no exertional symptoms. Pt notes he was outside in his yard working 2 days ago w/o exertional CP and dyspnea. He has had 2 episodes of post prandial CP lasting ~45 min in the last week. Yesterday's episode lasted >1 hr and cardiac enzymes negative x 2. EKG shows early repolarization changes in the inferior leads.  Tele shows NSR but frequent PVCs, raising concern for possible coronary ischemia. He continues to have mild substernal chest pressure currently and is on IV heparin. BP is soft at 100/66, thus will avoid IV nitro for now. Keep NPO and plan for LHC to reassess patency of LAD and r/o obstructive disease in other territories. If cath is unremarkable for any culprit lesions to explain his symptoms, we will treat as GI CP and will start PPI.   I have reviewed the risks, indications, and alternatives to cardiac catheterization and possible angioplasty/stenting with the patient. Risks include but are not limited to bleeding, infection, vascular injury, stroke, myocardial infection, arrhythmia, kidney injury, radiation-related injury in the case of prolonged fluoroscopy use, emergency cardiac surgery, and death. The patient understands the risks of serious complication is low (<5%).   2. PVCs: frequent but asymptomatic. Continue BB therapy w/ Coreg. Plan ischemic evaluation/ LHC. If negative, would recommend outpatient 48 holter monitor to assess PVC burden. Check 2D Echo to assess LVEF. K is WNL. Check TSH.   3. HLD: on statin therapy w/ Crestor. Lipid panel 10/2017 showed controlled LDL <70 mg/dL at 65 mg/dL. Continue Crestor at current dose.   For questions or updates, please contact [place] Please consult [email or web address] for contact info under        Signed, Lyda Jester, PA-C  03/28/2018, 8:09 AM

## 2018-03-28 NOTE — Discharge Summary (Signed)
Discharge Summary    Patient ID: GARMON DEHN MRN: [id number]; DOB: September 19, 1960  Admit date: 03/27/2018 Discharge date: 03/28/2018  Primary Care Provider: Denita Lung, MD  Primary Cardiologist: Dorris Carnes, MD  Primary Electrophysiologist:  None   Discharge Diagnoses    Active Problems:   Non-cardiac chest pain   Gastroesophageal reflux disease   Allergies No Known Allergies  Diagnostic Studies/Procedures    Diagnostic Left Heart Catheterization 03/28/18 Procedures   LEFT HEART CATH AND CORONARY ANGIOGRAPHY  Conclusion     Previously placed Prox LAD to Mid LAD stent (unknown type) is widely patent.  The left ventricular systolic function is normal.  LV end diastolic pressure is normal.  The left ventricular ejection fraction is 50-55% by visual estimate.   1. No significant obstructive CAD 2. Patent stent in the LAD 3. Normal LV function 4. Normal LVEDP  Plan: medical management.   Recommend Aspirin 81mg  daily for moderate CAD.  Coronary Diagrams   Diagnostic Diagram           History of Present Illness     57 y.o. moderately obese male with known CAD s/p acute anterolateral STEMI 10/2013, secondary to 100% occluded proximal LAD, treated with PTCA/DES placement and h/o HLD, who presented to the [facility] ED 03/27/18 with anterior CP, reminiscent of his prior angina.   The patient reported development of indigestion like discomfort in the epigastric area with eating 2 days prior to admit. Of note, this is the exact same symptom and exacerbating factor that preceding his prior ST segment elevation MI. The following day he spent most of the day in the yard doing yard work and felt fine. Subsequently after eating lunch on 03/27/18, he developed the same epigastric discomfort that was more prolonged lasting >1 hour. No accompanying arm pain, numbness/tingling, shortness of breath, diaphoresis, pre-syncope, palpitations. 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 Troponins were cycled and negative x 2. EKG showed early repolarization changes in the inferior leads. Telemetry showed frequent PVCs.  Given his symptom pattern, he was admitted for [facility].   Hospital Course     Pt underwent LHC, performed by Dr. Martinique, on 03/28/09. Cath showed no significant obstructive CAD. The previously placed LAD stent was patent. LVEF and LVEDP both were normal. His CP was ruled noncardiac. He left the cath lab in stable condition. Continued medical management for CAD recommended. He was continued on ASA, Plavix, statin,  blocker and Ace-I. He was monitored post cath and had no complications. His radial cath site remained stable. Based on his symptomology and nonobstructive cath, his CP was felt to be GI related. Treatment with an H2 blocker was recommended and he was started on famotidine (PEPCID) 10 mg BID. Given his frequent PVCs, it was recommended that his  blocker be increased. Coreg was increased from 6.25 mg BID to 12.5 mg BID. There was room in BP to allow for titration. Pt was monitored post cath until TR band removal. Radical cath sight and vital signs were stable. Dr. Debara Pickett determined pt could be discharged home.   Post hospital f/u will be arranged with APP in cardiology clinic to assess PVC response to increased dose of Coreg and he will continue routine f/u with Dr. Harrington Challenger for secondary prevention given prior h/o MI. He is instructed to f/u with PCP in 2-4 weeks for further management of GERD.   Consultants: none   Discharge Vitals  Blood pressure 119/73, pulse 62, temperature 98.3 F (36.8 C), temperature source Oral, resp. rate 18, height 5\' 10"  (1.778 m), weight 104.3 kg, SpO2 96 %.  Filed Weights   03/27/18 1439  Weight: 104.3 kg    Labs & Radiologic Studies    CBC Recent Labs    03/27/18 1440 03/28/18 0354  WBC 6.3 6.1  HGB 17.0 15.7  HCT 48.6 45.1  MCV 94.4 95.6  PLT 174 242    Basic Metabolic Panel Recent Labs    03/27/18 1440 03/28/18 0354  NA 138 138  K 3.9 3.7  CL 103 106  CO2 24 24  GLUCOSE 146* 160*  BUN 16 11  CREATININE 0.93 0.98  CALCIUM 10.0 8.7*   Liver Function Tests No results for input(s): AST, ALT, ALKPHOS, BILITOT, PROT, ALBUMIN in the last 72 hours. No results for input(s): LIPASE, AMYLASE in the last 72 hours. Cardiac Enzymes No results for input(s): CKTOTAL, CKMB, CKMBINDEX, TROPONINI in the last 72 hours. BNP Invalid input(s): POCBNP D-Dimer No results for input(s): DDIMER in the last 72 hours. Hemoglobin A1C No results for input(s): HGBA1C in the last 72 hours. Fasting Lipid Panel No results for input(s): CHOL, HDL, LDLCALC, TRIG, CHOLHDL, LDLDIRECT in the last 72 hours. Thyroid Function Tests No results for input(s): TSH, T4TOTAL, T3FREE, THYROIDAB in the last 72 hours.  Invalid input(s): FREET3 _____________  Dg Chest 2 View  Result Date: 03/27/2018 CLINICAL DATA:  Left-sided chest pain for 1 day EXAM: CHEST - 2 VIEW COMPARISON:  11/05/2013 FINDINGS: The heart size and mediastinal contours are within normal limits. Both lungs are clear. The visualized skeletal structures are unremarkable. IMPRESSION: No active cardiopulmonary disease. Electronically Signed   By: Inez Catalina M.D.   On: 03/27/2018 15:09   Disposition   Pt is being discharged home today in good condition.  Follow-up Plans & Appointments    Follow-up Information    Fay Records, MD Follow up.   Specialty:  Cardiology Why:  our office will call you with a hospital follow-up visit in 2 weeks  Contact information: [place] [address] [phone]        Denita Lung, MD Follow up in 3 week(s).   Specialty:  Family Medicine Why:  call to follow-up with primary care provider for acid reflux  Contact information: [place] [phone]            Discharge Medications    Allergies as of 03/28/2018   No Known Allergies     Medication List    TAKE these medications   acetaminophen 325 MG tablet Commonly known as:  TYLENOL Take 325-650 mg by mouth every 6 (six) hours as needed (for pain or headaches).   carvedilol 12.5 MG tablet Commonly known as:  COREG Take 1 tablet (12.5 mg total) by mouth 2 (two) times daily with a meal. What changed:    medication strength  See the new instructions.   clopidogrel 75 MG tablet Commonly known as:  PLAVIX TAKE 1 TABLET BY MOUTH EVERY DAY   [employer] ASPIRIN LOW DOSE 81 MG EC tablet Generic drug:  aspirin TAKE 1 TABLET BY MOUTH EVERY DAY   diphenhydrAMINE 25 mg capsule Commonly known as:  BENADRYL Take 25 mg by mouth every 6 (six) hours as needed (for any potential allergic reactions).   EYE VITAMINS PO Take 1 tablet by mouth daily.   famotidine 10 MG tablet Commonly known as:  PEPCID Take 1 tablet (10 mg total) by mouth 2 (two) times daily.   Fish Oil 1000 MG Caps Take 1,000 mg by mouth daily.   nitroGLYCERIN 0.4 MG SL tablet Commonly known as:  NITROSTAT Place 1 tablet (0.4 mg total) under the tongue every 5 (five) minutes x 3 doses as needed for chest pain.   ramipril 2.5 MG capsule Commonly known as:  ALTACE TAKE 1 CAPSULE (2.5 MG TOTAL) BY MOUTH DAILY.   rosuvastatin 40 MG tablet Commonly known as:  CRESTOR Take 1 tablet (40 mg total) by mouth daily.        Acute coronary syndrome (MI, NSTEMI, STEMI, etc) this admission?: No.    Outstanding Labs/Studies   None   Duration of Discharge Encounter   Greater than 30 minutes including physician time.  Signed, Lyda Jester, PA-C 03/28/2018, 3:42 PM

## 2018-03-28 NOTE — Progress Notes (Signed)
Case manager aware of code 40

## 2018-03-28 NOTE — Progress Notes (Signed)
Noted Code 65, Jeannie Crutchfield called, reviewed case, CM was instructed to cancel the code 64; Mindi Slicker [facility] [phone]

## 2018-03-28 NOTE — ED Notes (Signed)
Patient moved to hospital bed at this time.

## 2018-04-06 ENCOUNTER — Encounter: Payer: Self-pay | Admitting: Physician Assistant

## 2018-04-18 ENCOUNTER — Encounter: Payer: Self-pay | Admitting: Family Medicine

## 2018-04-18 ENCOUNTER — Ambulatory Visit: Payer: BLUE CROSS/BLUE SHIELD | Admitting: Family Medicine

## 2018-04-18 VITALS — BP 106/74 | HR 61 | Temp 98.3°F | Ht 71.0 in | Wt 240.8 lb

## 2018-04-18 DIAGNOSIS — E1169 Type 2 diabetes mellitus with other specified complication: Secondary | ICD-10-CM | POA: Insufficient documentation

## 2018-04-18 DIAGNOSIS — Z1211 Encounter for screening for malignant neoplasm of colon: Secondary | ICD-10-CM | POA: Diagnosis not present

## 2018-04-18 DIAGNOSIS — I2510 Atherosclerotic heart disease of native coronary artery without angina pectoris: Secondary | ICD-10-CM | POA: Diagnosis not present

## 2018-04-18 DIAGNOSIS — E7439 Other disorders of intestinal carbohydrate absorption: Secondary | ICD-10-CM

## 2018-04-18 DIAGNOSIS — K219 Gastro-esophageal reflux disease without esophagitis: Secondary | ICD-10-CM

## 2018-04-18 DIAGNOSIS — E119 Type 2 diabetes mellitus without complications: Secondary | ICD-10-CM | POA: Insufficient documentation

## 2018-04-18 NOTE — Progress Notes (Signed)
   Subjective:    Patient ID: Jonathan Lindsey, male    DOB: May 04, 1961, 57 y.o.   MRN: [id number]  HPI He is here for follow-up visit after recent hospitalization and evaluation for recurrent coronary disease.  He had symptoms similar to his first cardiac event with midepigastric distress.  He was seen in the emergency room and admitted.  Catheterization was done which did show patent stents.  He was then given Pepcid.  Since then he has had no more abdominal symptoms, no midepigastric pain acid reflux etc.  Blood work did show an elevated blood sugar. He also has not had colon cancer screening.  There is no family history of colon cancer or polyps.  Review of Systems     Objective:   Physical Exam Alert and in no distress.  Cardiac exam shows regular rhythm without murmurs or gallops.  Lungs are clear to auscultation. Hemoglobin A1c is 6.0      Assessment & Plan:  Gastroesophageal reflux disease, esophagitis presence not specified  Screening for colon cancer - Plan: Cologuard  Coronary artery disease involving native coronary artery of native heart without angina pectoris  Glucose intolerance - Plan: POCT glycosylated hemoglobin (Hb A1C) I discussed the continued use of Pepcid and at this point think it is reasonable to do.  We can really assess this in January.  If he continues have difficulty, I will refer to GI to have a more definitive diagnosis especially since the symptoms are similar to when he had his heart attack. I then discussed the fact that he is glucose intolerant and the risk of diabetes.  Strongly encouraged him to make diet and exercise changes.  Discussed adding the exercise regimen to his daily work routine.

## 2018-04-18 NOTE — Patient Instructions (Signed)
20 minutes of something physical every day or 150 minutes a week of something physical.  Unclear what is thank Cut back on white food.  Bread, rice, pasta, potatoes, sugar

## 2018-04-19 ENCOUNTER — Ambulatory Visit: Payer: BLUE CROSS/BLUE SHIELD | Admitting: Physician Assistant

## 2018-04-19 ENCOUNTER — Encounter: Payer: Self-pay | Admitting: Physician Assistant

## 2018-04-19 VITALS — BP 114/78 | HR 63 | Ht 71.0 in | Wt 240.1 lb

## 2018-04-19 DIAGNOSIS — E782 Mixed hyperlipidemia: Secondary | ICD-10-CM

## 2018-04-19 DIAGNOSIS — I2510 Atherosclerotic heart disease of native coronary artery without angina pectoris: Secondary | ICD-10-CM

## 2018-04-19 LAB — POCT GLYCOSYLATED HEMOGLOBIN (HGB A1C): Hemoglobin A1C: 6 % — AB (ref 4.0–5.6)

## 2018-04-19 MED ORDER — NITROGLYCERIN 0.4 MG SL SUBL: 0.4000 mg | SUBLINGUAL_TABLET | SUBLINGUAL | 3 refills | Status: DC | PRN

## 2018-04-19 NOTE — Progress Notes (Signed)
Cardiology Office Note    Date:  04/19/2018   ID:  Jonathan Lindsey, DOB 1961/07/11, MRN [id number]  PCP:  Denita Lung, MD  Cardiologist: Dr. Harrington Challenger  Chief Complaint: Hospital follow up   History of Present Illness:   Jonathan Lindsey is a 57 y.o. male with known CAD s/p acute anterolateral STEMI 10/2013, secondary to 100% occluded proximal LAD, treated with PTCA/DES placement and h/o HLD presents for follow up.   Admitted 03/2018 for chest/epigastric pain. Cath showed no significant obstructive CAD. The previously placed LAD stent was patent. LVEF and LVEDP both were normal. His CP was ruled noncardiac. Increased coreg for PVCs. Started on Pepcid for possible GI etiology of his pain.  Here today for follow up. No recurrent chest pain. The patient denies nausea, vomiting, fever, chest pain, palpitations, shortness of breath, orthopnea, PND, dizziness, syncope, cough, congestion, abdominal pain, hematochezia, melena, lower extremity edema.    Past Medical History:  Diagnosis Date  . CAD (coronary artery disease), native coronary artery    a. STEMI 10/2013 s/p PTCA & DES to pLAD b. Cath 03/2018 showed patent stent   . Eye abnormalities    left eye - no lens  . Myocardial infarction [facility]) 2015    Past Surgical History:  Procedure Laterality Date  . APPENDECTOMY    . EYE SURGERY  2003   EYE DETACHMENT RETINA AND BUCKLES  . HERNIA REPAIR  1990   NOT SURE ??rih WITH MESH  . LEFT HEART CATH AND CORONARY ANGIOGRAPHY N/A 03/28/2018   Procedure: LEFT HEART CATH AND CORONARY ANGIOGRAPHY;  Surgeon: Martinique, Peter M, MD;  Location: [facility];  Service: Cardiovascular;  Laterality: N/A;  . LEFT HEART CATHETERIZATION WITH CORONARY ANGIOGRAM N/A 11/05/2013   Procedure: LEFT HEART CATHETERIZATION WITH CORONARY ANGIOGRAM;  Surgeon: Clent Demark, MD;  Location: [place] [facility];  Service: Cardiovascular;  Laterality: N/A;    Current Medications: Prior to Admission medications     Medication Sig Start Date End Date Taking? Authorizing Provider  acetaminophen (TYLENOL) 325 MG tablet Take 325-650 mg by mouth every 6 (six) hours as needed (for pain or headaches).    [provider]  carvedilol (COREG) 12.5 MG tablet Take 1 tablet (12.5 mg total) by mouth 2 (two) times daily with a meal. 03/28/18   Lyda Jester M, PA-C  clopidogrel (PLAVIX) 75 MG tablet TAKE 1 TABLET BY MOUTH EVERY DAY 12/26/17   Fay Records, MD  [employer]S ASPIRIN LOW DOSE 81 MG EC tablet TAKE 1 TABLET BY MOUTH EVERY DAY 10/26/16   Fay Records, MD  diphenhydrAMINE (BENADRYL) 25 mg capsule Take 25 mg by mouth every 6 (six) hours as needed (for any potential allergic reactions).    [provider]  famotidine (PEPCID) 10 MG tablet Take 1 tablet (10 mg total) by mouth 2 (two) times daily. 03/28/18   Lyda Jester M, PA-C  Multiple Vitamins-Minerals (EYE VITAMINS PO) Take 1 tablet by mouth daily.    [provider]  nitroGLYCERIN (NITROSTAT) 0.4 MG SL tablet Place 1 tablet (0.4 mg total) under the tongue every 5 (five) minutes x 3 doses as needed for chest pain. 08/25/15   Fay Records, MD  Omega-3 Fatty Acids (FISH OIL) 1000 MG CAPS Take 1,000 mg by mouth daily.     [provider]  ramipril (ALTACE) 2.5 MG capsule TAKE 1 CAPSULE (2.5 MG TOTAL) BY MOUTH DAILY. 11/21/17   Fay Records, MD  rosuvastatin (  CRESTOR) 40 MG tablet Take 1 tablet (40 mg total) by mouth daily. 08/22/17   Fay Records, MD    Allergies:   Patient has no known allergies.   Social History   Socioeconomic History  . Marital status: Single    Spouse name: Not on file  . Number of children: Not on file  . Years of education: Not on file  . Highest education level: Not on file  Occupational History  . Not on file  Social Needs  . Financial resource strain: Not on file  . Food insecurity:    Worry: Not on file    Inability: Not on file  . Transportation needs:    Medical: Not on file     Non-medical: Not on file  Tobacco Use  . Smoking status: Never Smoker  . Smokeless tobacco: Never Used  Substance and Sexual Activity  . Alcohol use: Yes    Comment: rare  . Drug use: No  . Sexual activity: Yes  Lifestyle  . Physical activity:    Days per week: Not on file    Minutes per session: Not on file  . Stress: Not on file  Relationships  . Social connections:    Talks on phone: Not on file    Gets together: Not on file    Attends religious service: Not on file    Active member of club or organization: Not on file    Attends meetings of clubs or organizations: Not on file    Relationship status: Not on file  Other Topics Concern  . Not on file  Social History Narrative  . Not on file     Family History:  The patient's family history includes Cancer in his father; Diabetes in his mother; Hyperlipidemia in his mother; Hypertension in his mother.   ROS:   Please see the history of present illness.    ROS All other systems reviewed and are negative.   PHYSICAL EXAM:   VS:  BP 114/78   Pulse 63   Ht 5\' 11"  (1.803 m)   Wt 240 lb 1.9 oz (108.9 kg)   SpO2 95%   BMI 33.49 kg/m    GEN: Well nourished, well developed, in no acute distress  HEENT: normal  Neck: no JVD, carotid bruits, or masses Cardiac: RRR; no murmurs, rubs, or gallops,no edema  Respiratory:  clear to auscultation bilaterally, normal work of breathing GI: soft, nontender, nondistended, + BS MS: no deformity or atrophy  Skin: warm and dry, no rash Neuro:  Alert and Oriented x 3, Strength and sensation are intact Psych: euthymic mood, full affect  Wt Readings from Last 3 Encounters:  04/19/18 240 lb 1.9 oz (108.9 kg)  04/18/18 240 lb 12.8 oz (109.2 kg)  03/27/18 230 lb (104.3 kg)      Studies/Labs Reviewed:   EKG:  EKG is not ordered today.   Recent Labs: 03/28/2018: BUN 11; Creatinine, Ser 0.98; Hemoglobin 15.7; Platelets 155; Potassium 3.7; Sodium 138   Lipid Panel    Component Value  Date/Time   CHOL 116 10/18/2017 0802   TRIG 117 10/18/2017 0802   HDL 28 (L) 10/18/2017 0802   CHOLHDL 4.1 10/18/2017 0802   CHOLHDL 5.7 (H) 08/25/2015 0902   VLDL 35 (H) 08/25/2015 0902   LDLCALC 65 10/18/2017 0802    Additional studies/ records that were reviewed today include:   Diagnostic Left Heart Catheterization 03/28/18 Procedures   LEFT HEART CATH AND CORONARY ANGIOGRAPHY  Conclusion  Previously placed Prox LAD to Mid LAD stent (unknown type) is widely patent.  The left ventricular systolic function is normal.  LV end diastolic pressure is normal.  The left ventricular ejection fraction is 50-55% by visual estimate.  1. No significant obstructive CAD 2. Patent stent in the LAD 3. Normal LV function 4. Normal LVEDP  Plan: medical management.   Recommend Aspirin 81mg  daily for moderate CAD.  Coronary Diagrams   Diagnostic Diagram            ASSESSMENT & PLAN:    1. CAD - Patent stent by recent cath. No angina. Continue current medications with ASA, Plavix, BB and statin.  2. HLD - 10/18/2017: Cholesterol, Total 116; HDL 28; LDL Calculated 65; Triglycerides 117  - Continue statin     Medication Adjustments/Labs and Tests Ordered: Current medicines are reviewed at length with the patient today.  Concerns regarding medicines are outlined above.  Medication changes, Labs and Tests ordered today are listed in the Patient Instructions below. Patient Instructions  Medication Instructions:  Your physician recommends that you continue on your current medications as directed. Please refer to the Current Medication list given to you today.  Labwork: NONE  Testing/Procedures: NONE  Follow-Up: Your physician wants you to follow-up in: 6 months with Dr. Harrington Challenger. You will receive a reminder letter in the mail two months in advance. If you don't receive a letter, please call our office to schedule the follow-up appointment.   If you need a refill  on your cardiac medications before your next appointment, please call your pharmacy.       Jarrett Soho, [place]  04/19/2018 8:09 AM    [facility] [place], [place], [place]  [address] Phone: [phone]; Fax: [phone]

## 2018-04-19 NOTE — Patient Instructions (Signed)
Medication Instructions:  Your physician recommends that you continue on your current medications as directed. Please refer to the Current Medication list given to you today.  Labwork: NONE  Testing/Procedures: NONE  Follow-Up: Your physician wants you to follow-up in: 6 months with Dr. Harrington Challenger. You will receive a reminder letter in the mail two months in advance. If you don't receive a letter, please call our office to schedule the follow-up appointment.   If you need a refill on your cardiac medications before your next appointment, please call your pharmacy.

## 2018-04-20 ENCOUNTER — Encounter: Payer: Self-pay | Admitting: Family Medicine

## 2018-05-02 DIAGNOSIS — Z1211 Encounter for screening for malignant neoplasm of colon: Secondary | ICD-10-CM | POA: Diagnosis not present

## 2018-05-04 LAB — COLOGUARD: Cologuard: NEGATIVE

## 2018-08-15 ENCOUNTER — Other Ambulatory Visit: Payer: Self-pay | Admitting: Internal Medicine

## 2018-08-18 ENCOUNTER — Other Ambulatory Visit: Payer: Self-pay | Admitting: Internal Medicine

## 2018-09-25 ENCOUNTER — Other Ambulatory Visit (HOSPITAL_COMMUNITY): Payer: Self-pay | Admitting: Cardiology

## 2018-12-16 ENCOUNTER — Other Ambulatory Visit: Payer: Self-pay | Admitting: Internal Medicine

## 2019-02-09 DIAGNOSIS — H4089 Other specified glaucoma: Secondary | ICD-10-CM | POA: Diagnosis not present

## 2019-03-09 DIAGNOSIS — H4089 Other specified glaucoma: Secondary | ICD-10-CM | POA: Diagnosis not present

## 2019-03-11 NOTE — Progress Notes (Signed)
Cardiology Office Note    Date:  03/12/2019   ID:  Jonathan Lindsey, DOB 1960/08/16, MRN [id number]  PCP:  Denita Lung, MD  Cardiologist: Dr. Harrington Challenger  Chief Complaint: F/U of CAD   History of Present Illness:   Jonathan Lindsey is a 58 y.o. male with known CAD s/p acute anterolateral STEMI 10/2013, secondary to 100% occluded proximal LAD, treated with PTCA/DES placement and h/o HLD presents for follow up.   Admitted 03/2018 for chest/epigastric pain. Cath showed no significant obstructive CAD. The previously placed LAD stent was patent. LVEF and LVEDP both were normal. His CP was ruled noncardiac. Increased coreg for PVCs. Started on Pepcid for possible GI etiology of his pain.  Since seen he has done well  No CP  Breathing is OK   No dizzienss    Past Medical History:  Diagnosis Date  . CAD (coronary artery disease), native coronary artery    a. STEMI 10/2013 s/p PTCA & DES to pLAD b. Cath 03/2018 showed patent stent   . Eye abnormalities    left eye - no lens  . Myocardial infarction [facility]) 2015    Past Surgical History:  Procedure Laterality Date  . APPENDECTOMY    . EYE SURGERY  2003   EYE DETACHMENT RETINA AND BUCKLES  . HERNIA REPAIR  1990   NOT SURE ??rih WITH MESH  . LEFT HEART CATH AND CORONARY ANGIOGRAPHY N/A 03/28/2018   Procedure: LEFT HEART CATH AND CORONARY ANGIOGRAPHY;  Surgeon: Martinique, Peter M, MD;  Location: [facility];  Service: Cardiovascular;  Laterality: N/A;  . LEFT HEART CATHETERIZATION WITH CORONARY ANGIOGRAM N/A 11/05/2013   Procedure: LEFT HEART CATHETERIZATION WITH CORONARY ANGIOGRAM;  Surgeon: Clent Demark, MD;  Location: [facility] C[facility];  Service: Cardiovascular;  Laterality: N/A;    Current Medications: Prior to Admission medications   Medication Sig Start Date End Date Taking? Authorizing Provider  acetaminophen (TYLENOL) 325 MG tablet Take 325-650 mg by mouth every 6 (six) hours as needed (for pain or headaches).    [provider]  carvedilol (COREG) 12.5 MG tablet Take 1 tablet (12.5 mg total) by mouth 2 (two) times daily with a meal. 03/28/18   Lyda Jester M, PA-C  clopidogrel (PLAVIX) 75 MG tablet TAKE 1 TABLET BY MOUTH EVERY DAY 12/26/17   Fay Records, MD  [facility] ASPIRIN LOW DOSE 81 MG EC tablet TAKE 1 TABLET BY MOUTH EVERY DAY 10/26/16   Fay Records, MD  diphenhydrAMINE (BENADRYL) 25 mg capsule Take 25 mg by mouth every 6 (six) hours as needed (for any potential allergic reactions).    [provider]  famotidine (PEPCID) 10 MG tablet Take 1 tablet (10 mg total) by mouth 2 (two) times daily. 03/28/18   Lyda Jester M, PA-C  Multiple Vitamins-Minerals (EYE VITAMINS PO) Take 1 tablet by mouth daily.    [provider]  nitroGLYCERIN (NITROSTAT) 0.4 MG SL tablet Place 1 tablet (0.4 mg total) under the tongue every 5 (five) minutes x 3 doses as needed for chest pain. 08/25/15   Fay Records, MD  Omega-3 Fatty Acids (FISH OIL) 1000 MG CAPS Take 1,000 mg by mouth daily.     [provider]  ramipril (ALTACE) 2.5 MG capsule TAKE 1 CAPSULE (2.5 MG TOTAL) BY MOUTH DAILY. 11/21/17   Fay Records, MD  rosuvastatin (CRESTOR) 40 MG tablet Take 1 tablet (40 mg total) by mouth daily. 08/22/17   Dorris Carnes  V, MD    Allergies:   Patient has no known allergies.   Social History   Socioeconomic History  . Marital status: Single    Spouse name: Not on file  . Number of children: Not on file  . Years of education: Not on file  . Highest education level: Not on file  Occupational History  . Not on file  Social Needs  . Financial resource strain: Not on file  . Food insecurity    Worry: Not on file    Inability: Not on file  . Transportation needs    Medical: Not on file    Non-medical: Not on file  Tobacco Use  . Smoking status: Never Smoker  . Smokeless tobacco: Never Used  Substance and Sexual Activity  . Alcohol use: Yes    Comment: rare  . Drug use: No  . Sexual  activity: Yes  Lifestyle  . Physical activity    Days per week: Not on file    Minutes per session: Not on file  . Stress: Not on file  Relationships  . Social Herbalist on phone: Not on file    Gets together: Not on file    Attends religious service: Not on file    Active member of club or organization: Not on file    Attends meetings of clubs or organizations: Not on file    Relationship status: Not on file  Other Topics Concern  . Not on file  Social History Narrative  . Not on file     Family History:  The patient's family history includes Cancer in his father; Diabetes in his mother; Hyperlipidemia in his mother; Hypertension in his mother.   ROS:   Please see the history of present illness.    ROS All other systems reviewed and are negative.   PHYSICAL EXAM:   VS:  BP 124/76   Pulse 61   Ht 5\' 11"  (1.803 m)   Wt 243 lb (110.2 kg)   SpO2 97%   BMI 33.89 kg/m    GEN: Obese 58 yo, in no acute distress  HEENT: normal  Neck: no JVD, carotid bruits, or masses Cardiac: RRR; no murmurs, rubs, or gallops,no edema  Respiratory:  clear to auscultation bilaterally, normal work of breathing GI: soft, nontender, nondistended, + BS MS: no deformity or atrophy  Skin: warm and dry, no rash Neuro:  Alert and Oriented x 3, Strength and sensation are intact Psych: euthymic mood, full affect  Wt Readings from Last 3 Encounters:  03/12/19 243 lb (110.2 kg)  04/19/18 240 lb 1.9 oz (108.9 kg)  04/18/18 240 lb 12.8 oz (109.2 kg)      Studies/Labs Reviewed:   EKG:  EKG is not ordered today.   Recent Labs: 03/28/2018: BUN 11; Creatinine, Ser 0.98; Hemoglobin 15.7; Platelets 155; Potassium 3.7; Sodium 138   Lipid Panel    Component Value Date/Time   CHOL 116 10/18/2017 0802   TRIG 117 10/18/2017 0802   HDL 28 (L) 10/18/2017 0802   CHOLHDL 4.1 10/18/2017 0802   CHOLHDL 5.7 (H) 08/25/2015 0902   VLDL 35 (H) 08/25/2015 0902   LDLCALC 65 10/18/2017 0802     Additional studies/ records that were reviewed today include:   Diagnostic Left Heart Catheterization 03/28/18 Procedures   LEFT HEART CATH AND CORONARY ANGIOGRAPHY  Conclusion     Previously placed Prox LAD to Mid LAD stent (unknown type) is widely patent.  The left ventricular systolic  function is normal.  LV end diastolic pressure is normal.  The left ventricular ejection fraction is 50-55% by visual estimate.  1. No significant obstructive CAD 2. Patent stent in the LAD 3. Normal LV function 4. Normal LVEDP  Plan: medical management.   Recommend Aspirin 81mg  daily for moderate CAD.  Coronary Diagrams   Diagnostic Diagram            ASSESSMENT & PLAN:    1. CAD -.No symptoms of angina   Cath noted above   I would recomm keeping on curent regimen given location of stent   Follow labs  (CBC)  2. HLD ON statin   Check lipds today     3  HTN  Keep on current regimen  Encouraged him to stay active   Watch wt      F/U in 1 year    Medication Adjustments/Labs and Tests Ordered: Current medicines are reviewed at length with the patient today.  Concerns regarding medicines are outlined above.  Medication changes, Labs and Tests ordered today are listed in the Patient Instructions below. There are no Patient Instructions on file for this visit.   Signed, Dorris Carnes, MD  03/12/2019 2:13 PM    [facility] [address], [place], [place]  [address] Phone: [phone]; Fax: [phone]

## 2019-03-12 ENCOUNTER — Encounter: Payer: Self-pay | Admitting: Internal Medicine

## 2019-03-12 ENCOUNTER — Other Ambulatory Visit: Payer: Self-pay

## 2019-03-12 ENCOUNTER — Ambulatory Visit (INDEPENDENT_AMBULATORY_CARE_PROVIDER_SITE_OTHER): Payer: BC Managed Care – PPO | Admitting: Internal Medicine

## 2019-03-12 VITALS — BP 124/76 | HR 61 | Ht 71.0 in | Wt 243.0 lb

## 2019-03-12 DIAGNOSIS — I2510 Atherosclerotic heart disease of native coronary artery without angina pectoris: Secondary | ICD-10-CM | POA: Diagnosis not present

## 2019-03-12 DIAGNOSIS — E782 Mixed hyperlipidemia: Secondary | ICD-10-CM | POA: Diagnosis not present

## 2019-03-12 NOTE — Patient Instructions (Signed)
Medication Instructions:  No changes If you need a refill on your cardiac medications before your next appointment, please call your pharmacy.   Lab work: Today: cbc, bmet, lipids If you have labs (blood work) drawn today and your tests are completely normal, you will receive your results only by: Marland Kitchen MyChart Message (if you have MyChart) OR . A paper copy in the mail If you have any lab test that is abnormal or we need to change your treatment, we will call you to review the results.  Testing/Procedures: none  Follow-Up: At [facility], you and your health needs are our priority.  As part of our continuing mission to provide you with exceptional heart care, we have created designated Provider Care Teams.  These Care Teams include your primary Cardiologist (physician) and Advanced Practice Providers (APPs -  Physician Assistants and Nurse Practitioners) who all work together to provide you with the care you need, when you need it. You will need a follow up appointment in:  12 months.  Please call our office 2 months in advance to schedule this appointment.  You may see Dorris Carnes, MD or one of the following Advanced Practice Providers on your designated Care Team: Richardson Dopp, PA-C [place], [place] . Daune Perch, NP  Any Other Special Instructions Will Be Listed Below (If Applicable).

## 2019-03-13 ENCOUNTER — Other Ambulatory Visit: Payer: Self-pay | Admitting: Internal Medicine

## 2019-03-13 LAB — BASIC METABOLIC PANEL
BUN/Creatinine Ratio: 17 (ref 9–20)
BUN: 18 mg/dL (ref 6–24)
CO2: 22 mmol/L (ref 20–29)
Calcium: 9.1 mg/dL (ref 8.7–10.2)
Chloride: 101 mmol/L (ref 96–106)
Creatinine, Ser: 1.04 mg/dL (ref 0.76–1.27)
GFR calc Af Amer: 91 mL/min/{1.73_m2} (ref >59)
GFR calc non Af Amer: 79 mL/min/{1.73_m2} (ref >59)
Glucose: 111 mg/dL — ABNORMAL HIGH (ref 65–99)
Potassium: 4.3 mmol/L (ref 3.5–5.2)
Sodium: 139 mmol/L (ref 134–144)

## 2019-03-13 LAB — LIPID PANEL
Chol/HDL Ratio: 4.6 ratio (ref 0.0–5.0)
Cholesterol, Total: 139 mg/dL (ref 100–199)
HDL: 30 mg/dL — ABNORMAL LOW (ref >39)
LDL Calculated: 76 mg/dL (ref 0–99)
Triglycerides: 164 mg/dL — ABNORMAL HIGH (ref 0–149)
VLDL Cholesterol Cal: 33 mg/dL (ref 5–40)

## 2019-03-13 LAB — CBC
Hematocrit: 47 % (ref 37.5–51.0)
Hemoglobin: 16.5 g/dL (ref 13.0–17.7)
MCH: 34.4 pg — ABNORMAL HIGH (ref 26.6–33.0)
MCHC: 35.1 g/dL (ref 31.5–35.7)
MCV: 98 fL — ABNORMAL HIGH (ref 79–97)
Platelets: 168 10*3/uL (ref 150–450)
RBC: 4.8 x10E6/uL (ref 4.14–5.80)
RDW: 12.4 % (ref 11.6–15.4)
WBC: 5.7 10*3/uL (ref 3.4–10.8)

## 2019-03-20 ENCOUNTER — Other Ambulatory Visit: Payer: Self-pay | Admitting: Cardiology

## 2019-04-29 ENCOUNTER — Other Ambulatory Visit: Payer: Self-pay | Admitting: Internal Medicine

## 2019-05-10 ENCOUNTER — Telehealth: Payer: Self-pay | Admitting: Internal Medicine

## 2019-05-10 NOTE — Telephone Encounter (Signed)
Message to Dr. Harrington Challenger for review and recommendations.

## 2019-05-10 NOTE — Telephone Encounter (Signed)
I spoke to the patient who would like Dr Harrington Challenger to review his MyChart message from today regarding his leg.    He said that it "was hot to the touch yesterday, but not that way today."  It remains as explained in the My Chart message.     He would like Dr Harrington Challenger to review and give recommendations.

## 2019-05-10 NOTE — Telephone Encounter (Signed)
See accompanying note.  Initial symtpoms were in September

## 2019-05-10 NOTE — Telephone Encounter (Signed)
Follow Up:     Pt is calling to see if anybody have seen and taken care of his My- Chart message from this morning [please.

## 2019-05-10 NOTE — Telephone Encounter (Signed)
NOt being able to see leg it is difficult to say completely  The fact that it is improved is good   SOunds like inflammation is improved It does not sugg DVT Keep on current regimen  ELevate leg if swollen

## 2019-05-11 NOTE — Telephone Encounter (Signed)
I spoke to the patient who said that his leg is improving and he is elevating it while sitting.  He will monitor over the weekend and keep [place] updated.

## 2019-09-11 DIAGNOSIS — H4089 Other specified glaucoma: Secondary | ICD-10-CM | POA: Diagnosis not present

## 2019-12-14 ENCOUNTER — Other Ambulatory Visit: Payer: Self-pay | Admitting: Cardiology

## 2020-01-28 ENCOUNTER — Other Ambulatory Visit: Payer: Self-pay | Admitting: Internal Medicine

## 2020-02-28 DIAGNOSIS — Z23 Encounter for immunization: Secondary | ICD-10-CM | POA: Diagnosis not present

## 2020-03-11 DIAGNOSIS — H3323 Serous retinal detachment, bilateral: Secondary | ICD-10-CM | POA: Diagnosis not present

## 2020-03-11 DIAGNOSIS — H4089 Other specified glaucoma: Secondary | ICD-10-CM | POA: Diagnosis not present

## 2020-04-16 ENCOUNTER — Other Ambulatory Visit: Payer: Self-pay | Admitting: Internal Medicine

## 2020-04-16 ENCOUNTER — Other Ambulatory Visit: Payer: Self-pay | Admitting: Cardiology

## 2020-04-17 ENCOUNTER — Telehealth: Payer: Self-pay | Admitting: Internal Medicine

## 2020-04-17 MED ORDER — RAMIPRIL 2.5 MG PO CAPS: 2.5000 mg | ORAL_CAPSULE | Freq: Every day | ORAL | 0 refills | Status: DC

## 2020-04-17 MED ORDER — CLOPIDOGREL BISULFATE 75 MG PO TABS: 75.0000 mg | ORAL_TABLET | Freq: Every day | ORAL | 0 refills | Status: DC

## 2020-04-17 MED ORDER — ROSUVASTATIN CALCIUM 40 MG PO TABS: 40.0000 mg | ORAL_TABLET | Freq: Every day | ORAL | 0 refills | Status: DC

## 2020-04-17 MED ORDER — CARVEDILOL 12.5 MG PO TABS: ORAL_TABLET | ORAL | 0 refills | Status: DC

## 2020-04-17 NOTE — Telephone Encounter (Signed)
Patient wants to know if he needs to have lab work done prior to his appt on 08/13/19 with Dr. Harrington Challenger.

## 2020-05-01 NOTE — Telephone Encounter (Signed)
Called and let patient know that he should come to the appointment in Jan.  Any needed labs can be obtained at that time.

## 2020-05-26 ENCOUNTER — Emergency Department (HOSPITAL_COMMUNITY)
Admission: EM | Admit: 2020-05-26 | Discharge: 2020-05-26 | Disposition: A | Discharge status: Home / Self Care | Payer: BC Managed Care – PPO | Attending: Emergency Medicine | Admitting: Emergency Medicine

## 2020-05-26 ENCOUNTER — Other Ambulatory Visit: Payer: Self-pay

## 2020-05-26 DIAGNOSIS — Z5321 Procedure and treatment not carried out due to patient leaving prior to being seen by health care provider: Secondary | ICD-10-CM | POA: Diagnosis not present

## 2020-05-26 DIAGNOSIS — R6883 Chills (without fever): Secondary | ICD-10-CM | POA: Diagnosis not present

## 2020-05-26 DIAGNOSIS — R531 Weakness: Secondary | ICD-10-CM | POA: Insufficient documentation

## 2020-05-26 DIAGNOSIS — R251 Tremor, unspecified: Secondary | ICD-10-CM | POA: Diagnosis not present

## 2020-05-26 LAB — CBC
HCT: 45.7 % (ref 39.0–52.0)
Hemoglobin: 15.9 g/dL (ref 13.0–17.0)
MCH: 32.9 pg (ref 26.0–34.0)
MCHC: 34.8 g/dL (ref 30.0–36.0)
MCV: 94.4 fL (ref 80.0–100.0)
Platelets: 155 10*3/uL (ref 150–400)
RBC: 4.84 MIL/uL (ref 4.22–5.81)
RDW: 11.6 % (ref 11.5–15.5)
WBC: 16.4 10*3/uL — ABNORMAL HIGH (ref 4.0–10.5)
nRBC: 0 % (ref 0.0–0.2)

## 2020-05-26 LAB — BASIC METABOLIC PANEL
Anion gap: 13 (ref 5–15)
BUN: 14 mg/dL (ref 6–20)
CO2: 23 mmol/L (ref 22–32)
Calcium: 9.1 mg/dL (ref 8.9–10.3)
Chloride: 102 mmol/L (ref 98–111)
Creatinine, Ser: 1.03 mg/dL (ref 0.61–1.24)
GFR, Estimated: >60 mL/min (ref >60)
Glucose, Bld: 182 mg/dL — ABNORMAL HIGH (ref 70–99)
Potassium: 3.8 mmol/L (ref 3.5–5.1)
Sodium: 138 mmol/L (ref 135–145)

## 2020-05-26 NOTE — ED Triage Notes (Signed)
EMS stated, pt started having chills with shaking uncontrollable. No fever. Generalized weakness this is new.

## 2020-05-26 NOTE — ED Notes (Signed)
Called pt for vitals recheck and no answer

## 2020-05-26 NOTE — ED Triage Notes (Signed)
Pt. Stated, I was sitting at my desk and I started shaking and couldn't stop, I got up and walked around and couldn't stop. I did a lot of work yesterday in the garage more than usual so Im not sure if that's why I was feeling weak. Tha shaking stopped on the way here.

## 2020-05-27 ENCOUNTER — Encounter: Payer: Self-pay | Admitting: Family Medicine

## 2020-05-27 ENCOUNTER — Ambulatory Visit: Payer: BC Managed Care – PPO | Admitting: Family Medicine

## 2020-05-27 VITALS — BP 150/84 | Temp 101.0°F | Wt 248.4 lb

## 2020-05-27 DIAGNOSIS — R509 Fever, unspecified: Secondary | ICD-10-CM

## 2020-05-27 DIAGNOSIS — E7439 Other disorders of intestinal carbohydrate absorption: Secondary | ICD-10-CM | POA: Diagnosis not present

## 2020-05-27 DIAGNOSIS — L03115 Cellulitis of right lower limb: Secondary | ICD-10-CM

## 2020-05-27 LAB — POCT INFLUENZA A/B
Influenza A, POC: NEGATIVE
Influenza B, POC: NEGATIVE

## 2020-05-27 LAB — POC COVID19 BINAXNOW: SARS Coronavirus 2 Ag: NEGATIVE

## 2020-05-27 MED ORDER — CEPHALEXIN 500 MG PO CAPS: 500.0000 mg | ORAL_CAPSULE | Freq: Three times a day (TID) | ORAL | 0 refills | Status: DC

## 2020-05-27 NOTE — Progress Notes (Signed)
   Subjective:    Patient ID: Jonathan Lindsey, male    DOB: 1961-02-02, 59 y.o.   MRN: [id number]  HPI He is here for consult concerning the acute onset of fever and chills that started last night.  He did go to the emergency room however it was on lockdown.  Blood work was done which did show an elevated white count.  He does not complain of sore throat, earache, cough, smell or taste change.  He has had both Pfizer vaccines the third one given on October 4 and he also got the flu shot at that time.  History of prior to the onset of the symptoms was that he was cleaning the garage out on Sunday sweeping and moving boxes.  Apparently there was a lot of dust. Also he complains of some swelling to the medial right shin area and some discomfort proximal to that in the thigh//inguinal area.  Review of Systems     Objective:   Physical Exam Alert and in no distress. Tympanic membranes and canals are normal. Pharyngeal area is normal. Neck is supple without adenopathy or thyromegaly. Cardiac exam shows a regular sinus rhythm without murmurs or gallops. Lungs are clear to auscultation. Exam of his right lower extremity does show a 5 x 7 inch area of erythema and increased warmth with another smaller area of 2x3 inch area of erythema in the upper thigh with some adenopathy in the inguinal area near that. The emergency room medical record was reviewed however nothing was there but the blood count.      Assessment & Plan:  Fever and chills - Plan: cephALEXin (KEFLEX) 500 MG capsule  Glucose intolerance  Cellulitis of right lower extremity - Plan: cephALEXin (KEFLEX) 500 MG capsule Case was discussed with Dr. Lita Mains.  I will place him on Keflex.  The lesion on his leg was marked with a pen.  They are to keep in touch with me on a daily basis.  Recommend Tylenol 2 tablets 4 times per day for the aches and pains and judicious use of Aleve if needed. I explained that I will discuss his blood  sugar elevation at a later date.  He was comfortable with that. Over 40 minutes spent in reviewing his record, history and physical as well as discussion with Dr. Johnnye Sima.

## 2020-05-27 NOTE — Addendum Note (Signed)
Addended by: Elyse Jarvis on: 05/27/2020 12:34 PM   Modules accepted: Orders

## 2020-05-28 LAB — NOVEL CORONAVIRUS, NAA: SARS-CoV-2, NAA: NOT DETECTED

## 2020-05-28 LAB — SARS-COV-2, NAA 2 DAY TAT

## 2020-05-29 ENCOUNTER — Telehealth: Payer: Self-pay | Admitting: Family Medicine

## 2020-05-29 NOTE — Telephone Encounter (Signed)
Have him follow-up with me next Wednesday or sooner if he has trouble

## 2020-05-29 NOTE — Telephone Encounter (Signed)
Pt called and wanted to let you know he is feeling much better today

## 2020-06-02 NOTE — Telephone Encounter (Signed)
LVM for pt to call for a follow up appt wed. [place]

## 2020-06-10 ENCOUNTER — Other Ambulatory Visit: Payer: Self-pay

## 2020-06-10 ENCOUNTER — Encounter: Payer: Self-pay | Admitting: Family Medicine

## 2020-06-10 ENCOUNTER — Ambulatory Visit: Payer: BC Managed Care – PPO | Admitting: Family Medicine

## 2020-06-10 VITALS — BP 126/82 | HR 74 | Temp 96.6°F | Wt 246.8 lb

## 2020-06-10 DIAGNOSIS — L03115 Cellulitis of right lower limb: Secondary | ICD-10-CM | POA: Diagnosis not present

## 2020-06-10 DIAGNOSIS — E119 Type 2 diabetes mellitus without complications: Secondary | ICD-10-CM | POA: Diagnosis not present

## 2020-06-10 DIAGNOSIS — E669 Obesity, unspecified: Secondary | ICD-10-CM | POA: Diagnosis not present

## 2020-06-10 DIAGNOSIS — B351 Tinea unguium: Secondary | ICD-10-CM | POA: Insufficient documentation

## 2020-06-10 LAB — POCT GLYCOSYLATED HEMOGLOBIN (HGB A1C): Hemoglobin A1C: 6.9 % — AB (ref 4.0–5.6)

## 2020-06-10 NOTE — Patient Instructions (Signed)
Go to the American diabetes Association website or Familydoctor.org 20 minutes of something physical daily or 150 minutes a week of something Time to look at cutting back on carbohydrates

## 2020-06-10 NOTE — Progress Notes (Signed)
° °  Subjective:    Patient ID: Jonathan Lindsey, male    DOB: 21-May-1961, 59 y.o.   MRN: [id number]  HPI He is here for a recheck.  On his recent emergency room visit, his blood sugar was elevated.  He states that it took him a couple of days to feel better with the cellulitis in his leg and with his breathing.  Since then he is had no symptoms.  Blood pressure was low elevated with his last visit as well.  He also has thickening of his right great toenail he would like some help with.   Review of Systems     Objective:   Physical Exam Alert and in no distress.  Exam of the lower extremity shows no swelling erythema or warmth.  Blood pressure is quite good.  Hemoglobin A1c 6.9.  Exam of the right great toe does show extensive thickening of the nail       Assessment & Plan:  New onset type 2 diabetes mellitus ([place]) - Plan: HgB A1c  Cellulitis of right lower extremity  Obesity (BMI 30-39.9)  Onychomycosis - Plan: Ambulatory referral to Podiatry Discussed diet and exercise as well as potentially medications.  I gave him a couple of websites to go to to get further information concerning diabetes.  He is to return here in 1 month for recheck on that. Discussed options concerning care of the toenail and he would prefer to have it removed. Greater than 30 minutes spent in counseling and coordination of care.

## 2020-06-25 ENCOUNTER — Other Ambulatory Visit: Payer: Self-pay

## 2020-06-25 ENCOUNTER — Encounter: Payer: Self-pay | Admitting: Podiatry

## 2020-06-25 ENCOUNTER — Ambulatory Visit: Payer: BC Managed Care – PPO | Admitting: Podiatry

## 2020-06-25 DIAGNOSIS — L600 Ingrowing nail: Secondary | ICD-10-CM | POA: Diagnosis not present

## 2020-06-25 MED ORDER — NEOMYCIN-POLYMYXIN-HC 3.5-10000-1 OT SOLN: OTIC | 1 refills | Status: DC

## 2020-06-25 NOTE — Progress Notes (Signed)
Subjective:   Patient ID: Jonathan Lindsey, male   DOB: 59 y.o.   MRN: [id number]   HPI Patient presents stating he has had a very thickened deformed big toenail left and he is trying to trim it tried to soak it without relief and he wants it removed due to the chronic discomfort patient does not smoke likes to be active   Review of Systems  All other systems reviewed and are negative.       Objective:  Physical Exam Vitals and nursing note reviewed.  Constitutional:      Appearance: He is well-developed.  Pulmonary:     Effort: Pulmonary effort is normal.  Musculoskeletal:        General: Normal range of motion.  Skin:    General: Skin is warm.  Neurological:     Mental Status: He is alert.     Neurovascular status intact muscle strength adequate range of motion within normal limits with patient found to have very thickened deformed big toenail left very sore when pressed making shoe gear difficult.  Patient is found to have good digital perfusion well oriented x3 with obvious history of trauma to this nailbed     Assessment:  Chronic deformed big toenail left with pain     Plan:  H&P reviewed condition recommended correction.  Explained procedure risk and patient wants surgery understanding risk and understand it will be permanent and today I infiltrated the left hallux 60 mg like Marcaine mixture sterile prep done and using sterile instrumentation remove the hallux nail exposed matrix applied phenol 5 applications 30 seconds followed by alcohol lavage and sterile dressing gave instructions on soaks and to leave dressing on 24 hours but take it off earlier if any throbbing were to occur and encouraged to call with questions concerns

## 2020-06-25 NOTE — Patient Instructions (Signed)

## 2020-07-08 ENCOUNTER — Other Ambulatory Visit: Payer: Self-pay

## 2020-07-08 ENCOUNTER — Encounter: Payer: Self-pay | Admitting: Family Medicine

## 2020-07-08 ENCOUNTER — Ambulatory Visit: Payer: BC Managed Care – PPO | Admitting: Family Medicine

## 2020-07-08 VITALS — BP 128/76 | HR 70 | Temp 96.5°F | Wt 247.2 lb

## 2020-07-08 DIAGNOSIS — E669 Obesity, unspecified: Secondary | ICD-10-CM | POA: Diagnosis not present

## 2020-07-08 DIAGNOSIS — E1159 Type 2 diabetes mellitus with other circulatory complications: Secondary | ICD-10-CM

## 2020-07-08 DIAGNOSIS — I2510 Atherosclerotic heart disease of native coronary artery without angina pectoris: Secondary | ICD-10-CM | POA: Diagnosis not present

## 2020-07-08 MED ORDER — ONETOUCH DELICA LANCETS 33G MISC: 1.0000 | Freq: Every day | 4 refills | Status: DC

## 2020-07-08 MED ORDER — GLUCOSE BLOOD VI STRP: ORAL_STRIP | 12 refills | Status: DC

## 2020-07-08 NOTE — Progress Notes (Signed)
   Subjective:    Patient ID: Jonathan Lindsey, male    DOB: Nov 26, 1960, 59 y.o.   MRN: [id number]  HPI He is here for follow-up on recent diagnosis of diabetes.  He was given websites to go to however he did not go to these websites.  He does have an underlying history of coronary artery disease with STEMI.   Review of Systems     Objective:   Physical Exam Alert and in no distress otherwise not examined       Assessment & Plan:  Type 2 diabetes mellitus with other circulatory complication, without long-term current use of insulin (HCC)  Obesity (BMI 30-39.9)  Coronary artery disease involving native coronary artery of native heart without angina pectoris I again discussed diabetes in regard to stroke, blindness, further heart disease, kidney and vascular issues.  Encouraged him to go to the website to get more data on this.  Also discussed proper eye care and foot care.  Today he is instructed on proper use of the glucometer and to check it either before a meal or 2 hours after meal.  Discussed getting into a regular routine in terms of diet and exercise with him.  Recheck here in several months.  Approximately 35 minutes spent discussing these issues with him.

## 2020-07-17 ENCOUNTER — Other Ambulatory Visit: Payer: Self-pay | Admitting: Internal Medicine

## 2020-07-22 ENCOUNTER — Other Ambulatory Visit: Payer: Self-pay | Admitting: Internal Medicine

## 2020-07-22 ENCOUNTER — Other Ambulatory Visit: Payer: Self-pay

## 2020-07-22 ENCOUNTER — Encounter: Payer: Self-pay | Admitting: Family Medicine

## 2020-07-22 DIAGNOSIS — E1159 Type 2 diabetes mellitus with other circulatory complications: Secondary | ICD-10-CM

## 2020-07-22 MED ORDER — GLUCOSE BLOOD VI STRP: ORAL_STRIP | 12 refills | Status: DC

## 2020-07-22 MED ORDER — ONETOUCH DELICA LANCETS 33G MISC: 1.0000 | Freq: Every day | 4 refills | Status: DC

## 2020-08-07 NOTE — Progress Notes (Signed)
Cardiology Office Note    Date:  08/12/2020   ID:  Jonathan Lindsey, DOB 10/06/1960, MRN [id number]  PCP:  Denita Lung, MD  Cardiologist: Dr. Harrington Challenger  Chief Complaint: F/U of CAD   History of Present Illness:   Jonathan Lindsey is a 60 y.o. male with known CAD s/p acute anterolateral STEMI 10/2013, secondary to 100% occluded proximal LAD, treated with PTCA/DES placement and h/o HLD presents for follow up.   Admitted 03/2018 for chest/epigastric pain. Cath showed no significant obstructive CAD. The previously placed LAD stent was patent. LVEF and LVEDP both were normal. His CP was ruled noncardiac. Increased coreg for PVCs. Started on Pepcid for possible GI etiology of his pain.  I saw the pt in 2020.  Since seen he has done okay.  He is followed by Dr. Redmond School.  Note sugars have been elevated and his hemoglobin A1c was 6.9%.  (November 2021).  He is starting to work with diet.  He has a follow-up with Dr. Redmond School in April.  Patient denies chest pain.  Breathing is okay.  He admits that he needs to walk more at, exercise more.  Diet: Breakfast couple coffee, egg.  Lunch leftovers from dinner before.  Dinner eats around 630.  Meat potatoes vegetables.  Past Medical History:  Diagnosis Date  . CAD (coronary artery disease), native coronary artery    a. STEMI 10/2013 s/p PTCA & DES to pLAD b. Cath 03/2018 showed patent stent   . Eye abnormalities    left eye - no lens  . Myocardial infarction [facility]) 2015    Past Surgical History:  Procedure Laterality Date  . APPENDECTOMY    . EYE SURGERY  2003   EYE DETACHMENT RETINA AND BUCKLES  . HERNIA REPAIR  1990   NOT SURE ??rih WITH MESH  . LEFT HEART CATH AND CORONARY ANGIOGRAPHY N/A 03/28/2018   Procedure: LEFT HEART CATH AND CORONARY ANGIOGRAPHY;  Surgeon: Martinique, Peter M, MD;  Location: [facility];  Service: Cardiovascular;  Laterality: N/A;  . LEFT HEART CATHETERIZATION WITH CORONARY ANGIOGRAM N/A 11/05/2013   Procedure:  LEFT HEART CATHETERIZATION WITH CORONARY ANGIOGRAM;  Surgeon: Clent Demark, MD;  Location: [place] CATH [facility];  Service: Cardiovascular;  Laterality: N/A;    Current Medications: Prior to Admission medications   Medication Sig Start Date End Date Taking? Authorizing Provider  acetaminophen (TYLENOL) 325 MG tablet Take 325-650 mg by mouth every 6 (six) hours as needed (for pain or headaches).    [provider]  carvedilol (COREG) 12.5 MG tablet Take 1 tablet (12.5 mg total) by mouth 2 (two) times daily with a meal. 03/28/18   Lyda Jester M, PA-C  clopidogrel (PLAVIX) 75 MG tablet TAKE 1 TABLET BY MOUTH EVERY DAY 12/26/17   Fay Records, MD  [facility] ASPIRIN LOW DOSE 81 MG EC tablet TAKE 1 TABLET BY MOUTH EVERY DAY 10/26/16   Fay Records, MD  diphenhydrAMINE (BENADRYL) 25 mg capsule Take 25 mg by mouth every 6 (six) hours as needed (for any potential allergic reactions).    [provider]  famotidine (PEPCID) 10 MG tablet Take 1 tablet (10 mg total) by mouth 2 (two) times daily. 03/28/18   Lyda Jester M, PA-C  Multiple Vitamins-Minerals (EYE VITAMINS PO) Take 1 tablet by mouth daily.    [provider]  nitroGLYCERIN (NITROSTAT) 0.4 MG SL tablet Place 1 tablet (0.4 mg total) under the tongue every 5 (five) minutes x 3 doses  as needed for chest pain. 08/25/15   Fay Records, MD  Omega-3 Fatty Acids (FISH OIL) 1000 MG CAPS Take 1,000 mg by mouth daily.     [provider]  ramipril (ALTACE) 2.5 MG capsule TAKE 1 CAPSULE (2.5 MG TOTAL) BY MOUTH DAILY. 11/21/17   Fay Records, MD  rosuvastatin (CRESTOR) 40 MG tablet Take 1 tablet (40 mg total) by mouth daily. 08/22/17   Fay Records, MD    Allergies:   Patient has no known allergies.   Social History   Socioeconomic History  . Marital status: Married    Spouse name: Not on file  . Number of children: Not on file  . Years of education: Not on file  . Highest education level: Not on file  Occupational  History  . Not on file  Tobacco Use  . Smoking status: Never Smoker  . Smokeless tobacco: Never Used  Vaping Use  . Vaping Use: Never used  Substance and Sexual Activity  . Alcohol use: Yes    Comment: rare  . Drug use: No  . Sexual activity: Yes  Other Topics Concern  . Not on file  Social History Narrative  . Not on file   Social Determinants of Health   Financial Resource Strain: Not on file  Food Insecurity: Not on file  Transportation Needs: Not on file  Physical Activity: Not on file  Stress: Not on file  Social Connections: Not on file     Family History:  The patient's family history includes Cancer in his father; Diabetes in his mother; Hyperlipidemia in his mother; Hypertension in his mother.   ROS:   Please see the history of present illness.    ROS All other systems reviewed and are negative.   PHYSICAL EXAM:   VS:  BP 124/68   Pulse 68   Ht 5\' 11"  (1.803 m)   Wt 244 lb (110.7 kg)   SpO2 95%   BMI 34.03 kg/m    GEN: Obese 60 yo, in no acute distress  HEENT: normal  Neck: no JVD, carotid bruits Cardiac: RRR; no murmur.  No lower extremity edema  Respiratory:  clear to auscultation bilaterally, normal work of breathing GI: soft, nontender, nondistended, + BS MS: no deformity or atrophy  Skin: warm and dry, no rash Neuro:  Alert and Oriented x 3, Strength and sensation are intact Psych: euthymic mood, full affect  Wt Readings from Last 3 Encounters:  08/12/20 244 lb (110.7 kg)  07/08/20 247 lb 3.2 oz (112.1 kg)  06/10/20 246 lb 12.8 oz (111.9 kg)      Studies/Labs Reviewed:   EKG:  EKG is not ordered today.   Recent Labs: 05/26/2020: BUN 14; Creatinine, Ser 1.03; Hemoglobin 15.9; Platelets 155; Potassium 3.8; Sodium 138   Lipid Panel    Component Value Date/Time   CHOL 139 03/12/2019 1428   TRIG 164 (H) 03/12/2019 1428   HDL 30 (L) 03/12/2019 1428   CHOLHDL 4.6 03/12/2019 1428   CHOLHDL 5.7 (H) 08/25/2015 0902   VLDL 35 (H) 08/25/2015  0902   LDLCALC 76 03/12/2019 1428    Additional studies/ records that were reviewed today include:   Diagnostic Left Heart Catheterization 03/28/18 Procedures   LEFT HEART CATH AND CORONARY ANGIOGRAPHY  Conclusion     Previously placed Prox LAD to Mid LAD stent (unknown type) is widely patent.  The left ventricular systolic function is normal.  LV end diastolic pressure is normal.  The left  ventricular ejection fraction is 50-55% by visual estimate.  1. No significant obstructive CAD 2. Patent stent in the LAD 3. Normal LV function 4. Normal LVEDP  Plan: medical management.   Recommend Aspirin 81mg  daily for moderate CAD.  Coronary Diagrams   Diagnostic Diagram            ASSESSMENT & PLAN:   1.  Coronary artery disease.  Patient denies any symptoms to suggest angina. Continue to follow  -. 2. HLD.  Patient continues on statin Crestor 40.  We will check lipids today.  3  HTN  Keep on current regimen.  Remains good.  Follow-up.  4.  Diabetes.  Patient started to work on diet we discussed this further.  We also discussed time restricted eating.  I will set follow-up for December.  Sooner for problems.  Encouraged him to stay active   Watch wt         Medication Adjustments/Labs and Tests Ordered: Current medicines are reviewed at length with the patient today.  Concerns regarding medicines are outlined above.  Medication changes, Labs and Tests ordered today are listed in the Patient Instructions below. There are no Patient Instructions on file for this visit.   Signed, Dorris Carnes, MD  08/12/2020 10:48 AM    [address], [place], [place]  [address] Phone: [phone]; Fax: [phone]

## 2020-08-12 ENCOUNTER — Encounter: Payer: Self-pay | Admitting: Internal Medicine

## 2020-08-12 ENCOUNTER — Other Ambulatory Visit: Payer: Self-pay

## 2020-08-12 ENCOUNTER — Ambulatory Visit: Payer: BC Managed Care – PPO | Admitting: Internal Medicine

## 2020-08-12 VITALS — BP 124/68 | HR 68 | Ht 71.0 in | Wt 244.0 lb

## 2020-08-12 DIAGNOSIS — E782 Mixed hyperlipidemia: Secondary | ICD-10-CM | POA: Diagnosis not present

## 2020-08-12 DIAGNOSIS — I2510 Atherosclerotic heart disease of native coronary artery without angina pectoris: Secondary | ICD-10-CM

## 2020-08-12 LAB — LIPID PANEL
Chol/HDL Ratio: 3.8 ratio (ref 0.0–5.0)
Cholesterol, Total: 113 mg/dL (ref 100–199)
HDL: 30 mg/dL — ABNORMAL LOW (ref >39)
LDL Chol Calc (NIH): 59 mg/dL (ref 0–99)
Triglycerides: 134 mg/dL (ref 0–149)
VLDL Cholesterol Cal: 24 mg/dL (ref 5–40)

## 2020-08-12 LAB — CBC
Hematocrit: 46.9 % (ref 37.5–51.0)
Hemoglobin: 16.7 g/dL (ref 13.0–17.7)
MCH: 33.7 pg — ABNORMAL HIGH (ref 26.6–33.0)
MCHC: 35.6 g/dL (ref 31.5–35.7)
MCV: 95 fL (ref 79–97)
Platelets: 168 10*3/uL (ref 150–450)
RBC: 4.96 x10E6/uL (ref 4.14–5.80)
RDW: 12.2 % (ref 11.6–15.4)
WBC: 5.6 10*3/uL (ref 3.4–10.8)

## 2020-08-12 NOTE — Patient Instructions (Signed)
Medication Instructions:  No changes *If you need a refill on your cardiac medications before your next appointment, please call your pharmacy*   Lab Work: Today: cbc, lipids  If you have labs (blood work) drawn today and your tests are completely normal, you will receive your results only by: Marland Kitchen MyChart Message (if you have MyChart) OR . A paper copy in the mail If you have any lab test that is abnormal or we need to change your treatment, we will call you to review the results.   Testing/Procedures: none   Follow-Up: At [facility], you and your health needs are our priority.  As part of our continuing mission to provide you with exceptional heart care, we have created designated Provider Care Teams.  These Care Teams include your primary Cardiologist (physician) and Advanced Practice Providers (APPs -  Physician Assistants and Nurse Practitioners) who all work together to provide you with the care you need, when you need it.  We recommend signing up for the patient portal called "MyChart".  Sign up information is provided on this After Visit Summary.  MyChart is used to connect with patients for Virtual Visits (Telemedicine).  Patients are able to view lab/test results, encounter notes, upcoming appointments, etc.  Non-urgent messages can be sent to your provider as well.   To learn more about what you can do with MyChart, go to [email or web address].    Your next appointment:   11 month(s) Edwin Cap 2022)  The format for your next appointment:   In Person  Provider:   You may see Dorris Carnes, MD or one of the following Advanced Practice Providers on your designated Care Team:    Richardson Dopp, PA-C  Robbie Lis, [place]   Other Instructions

## 2020-08-13 ENCOUNTER — Other Ambulatory Visit: Payer: Self-pay | Admitting: Internal Medicine

## 2020-10-23 ENCOUNTER — Other Ambulatory Visit: Payer: Self-pay | Admitting: Internal Medicine

## 2020-10-23 DIAGNOSIS — I2510 Atherosclerotic heart disease of native coronary artery without angina pectoris: Secondary | ICD-10-CM

## 2020-10-23 DIAGNOSIS — E785 Hyperlipidemia, unspecified: Secondary | ICD-10-CM

## 2020-10-23 DIAGNOSIS — E1159 Type 2 diabetes mellitus with other circulatory complications: Secondary | ICD-10-CM

## 2020-10-23 DIAGNOSIS — E782 Mixed hyperlipidemia: Secondary | ICD-10-CM

## 2020-11-07 ENCOUNTER — Ambulatory Visit: Payer: BC Managed Care – PPO | Admitting: Family Medicine

## 2020-11-07 ENCOUNTER — Other Ambulatory Visit: Payer: Self-pay

## 2020-11-07 DIAGNOSIS — K429 Umbilical hernia without obstruction or gangrene: Secondary | ICD-10-CM

## 2020-11-07 DIAGNOSIS — Z8719 Personal history of other diseases of the digestive system: Secondary | ICD-10-CM

## 2020-11-07 DIAGNOSIS — I2510 Atherosclerotic heart disease of native coronary artery without angina pectoris: Secondary | ICD-10-CM | POA: Diagnosis not present

## 2020-11-07 DIAGNOSIS — E1159 Type 2 diabetes mellitus with other circulatory complications: Secondary | ICD-10-CM | POA: Diagnosis not present

## 2020-11-07 DIAGNOSIS — E669 Obesity, unspecified: Secondary | ICD-10-CM | POA: Diagnosis not present

## 2020-11-07 DIAGNOSIS — Z800 Family history of malignant neoplasm of digestive organs: Secondary | ICD-10-CM

## 2020-11-07 LAB — POCT GLYCOSYLATED HEMOGLOBIN (HGB A1C): Hemoglobin A1C: 5.8 % — AB (ref 4.0–5.6)

## 2020-11-07 NOTE — Patient Instructions (Addendum)
Periodically check your sugars 2 hours after your meals Take 2 Tylenol 4 times per day regularly Hemorrhoids Hemorrhoids are swollen veins that may develop:  In the butt (rectum). These are called internal hemorrhoids.  Around the opening of the butt (anus). These are called external hemorrhoids. Hemorrhoids can cause pain, itching, or bleeding. Most of the time, they do not cause serious problems. They usually get better with diet changes, lifestyle changes, and other home treatments. What are the causes? This condition may be caused by:  Having trouble pooping (constipation).  Pushing hard (straining) to poop.  Watery poop (diarrhea).  Pregnancy.  Being very overweight (obese).  Sitting for long periods of time.  Heavy lifting or other activity that causes you to strain.  Anal sex.  Riding a bike for a long period of time. What are the signs or symptoms? Symptoms of this condition include:  Pain.  Itching or soreness in the butt.  Bleeding from the butt.  Leaking poop.  Swelling in the area.  One or more lumps around the opening of your butt. How is this diagnosed? A doctor can often diagnose this condition by looking at the affected area. The doctor may also:  Do an exam that involves feeling the area with a gloved hand (digital rectal exam).  Examine the area inside your butt using a small tube (anoscope).  Order blood tests. This may be done if you have lost a lot of blood.  Have you get a test that involves looking inside the colon using a flexible tube with a camera on the end (sigmoidoscopy or colonoscopy). How is this treated? This condition can usually be treated at home. Your doctor may tell you to change what you eat, make lifestyle changes, or try home treatments. If these do not help, procedures can be done to remove the hemorrhoids or make them smaller. These may involve:  Placing rubber bands at the base of the hemorrhoids to cut off their blood  supply.  Injecting medicine into the hemorrhoids to shrink them.  Shining a type of light energy onto the hemorrhoids to cause them to fall off.  Doing surgery to remove the hemorrhoids or cut off their blood supply. Follow these instructions at home: Eating and drinking  Eat foods that have a lot of fiber in them. These include whole grains, beans, nuts, fruits, and vegetables.  Ask your doctor about taking products that have added fiber (fibersupplements).  Reduce the amount of fat in your diet. You can do this by: ? Eating low-fat dairy products. ? Eating less red meat. ? Avoiding processed foods.  Drink enough fluid to keep your pee (urine) pale yellow.   Managing pain and swelling  Take a warm-water bath (sitz bath) for 20 minutes to ease pain. Do this 3-4 times a day. You may do this in a bathtub or using a portable sitz bath that fits over the toilet.  If told, put ice on the painful area. It may be helpful to use ice between your warm baths. ? Put ice in a plastic bag. ? Place a towel between your skin and the bag. ? Leave the ice on for 20 minutes, 2-3 times a day.   General instructions  Take over-the-counter and prescription medicines only as told by your doctor. ? Medicated creams and medicines may be used as told.  Exercise often. Ask your doctor how much and what kind of exercise is best for you.  Go to the bathroom when you  have the urge to poop. Do not wait.  Avoid pushing too hard when you poop.  Keep your butt dry and clean. Use wet toilet paper or moist towelettes after pooping.  Do not sit on the toilet for a long time.  Keep all follow-up visits as told by your doctor. This is important. Contact a doctor if you:  Have pain and swelling that do not get better with treatment or medicine.  Have trouble pooping.  Cannot poop.  Have pain or swelling outside the area of the hemorrhoids. Get help right away if you have:  Bleeding that will not  stop. Summary  Hemorrhoids are swollen veins in the butt or around the opening of the butt.  They can cause pain, itching, or bleeding.  Eat foods that have a lot of fiber in them. These include whole grains, beans, nuts, fruits, and vegetables.  Take a warm-water bath (sitz bath) for 20 minutes to ease pain. Do this 3-4 times a day. This information is not intended to replace advice given to you by your health care provider. Make sure you discuss any questions you have with your health care provider. Document Revised: 07/13/2018 Document Reviewed: 11/24/2017 Elsevier Patient Education  2021 [employer].  for the next week  Use TUCKS

## 2020-11-07 NOTE — Progress Notes (Signed)
  Subjective:    Patient ID: Jonathan Lindsey, male    DOB: Jan 19, 1961, 60 y.o.   MRN: [id number]

## 2020-11-07 NOTE — Progress Notes (Signed)
Subjective:    Patient ID: Jonathan Lindsey, male    DOB: 27-Jun-1961, 60 y.o.   MRN: [id number]  Jonathan Lindsey is a 60 y.o. male who presents for follow-up of Type 2 diabetes mellitus.  Home blood sugar records: meter records, fasting, 150 avg Current symptoms/problems include none at this time. Daily foot checks:yes   Any foot concerns: none Exercise: staying active Diet:good He has been checking his blood sugars usually in the morning and does have a record of it.  He has made some dietary changes and has lost 18 pounds!  He also complains of some periumbilical discomfort and has noted occasional swelling.  He also has had difficulty with hemorrhoids but presently is not having any trouble.  He now mentions the fact that he has a brother that has been diagnosed with colon cancer.  Review of the record indicates that he would be due for a Cologuard test this year anyway. The following portions of the patient's history were reviewed and updated as appropriate: allergies, current medications, past medical history, past social history and problem list.  ROS as in subjective above.     Objective:    Physical Exam Alert and in no distress exam of the umbilical area does show a very small umbilical hernia.  Rectal exam shows no external hemorrhoids.  No other lesions are noted. Hemoglobin A1c is 5.8. Lab Review Diabetic Labs Latest Ref Rng & Units 08/12/2020 06/10/2020 05/26/2020 03/12/2019 04/19/2018  HbA1c 4.0 - 5.6 % - 6.9(A) - - 6.0(A)  Chol 100 - 199 mg/dL 113 - - 139 -  HDL >39 mg/dL 30(L) - - 30(L) -  Calc LDL 0 - 99 mg/dL 59 - - 76 -  Triglycerides 0 - 149 mg/dL 134 - - 164(H) -  Creatinine 0.61 - 1.24 mg/dL - - 1.03 1.04 -  GFR >60.00 mL/min - - - - -   BP/Weight 08/12/2020 07/08/2020 06/10/2020 05/27/2020 4/97/0263  Systolic BP 785 885 027 741 287  Diastolic BP 68 76 82 84 76  Wt. (Lbs) 244 247.2 246.8 248.4 243  BMI 34.03 34.48 34.42 34.64 33.89    Elie  reports that  he has never smoked. He has never used smokeless tobacco. He reports current alcohol use. He reports that he does not use drugs.     Assessment & Plan:    Type 2 diabetes mellitus with other circulatory complication, without long-term current use of insulin (HCC) - Plan: POCT glycosylated hemoglobin (Hb A1C)  Obesity (BMI 30-39.9)  Coronary artery disease involving native coronary artery of native heart without angina pectoris  Umbilical hernia without obstruction and without gangrene  History of hemorrhoids  Family history of colon cancer - Plan: Ambulatory referral to Gastroenterology   1. Rx changes: none 2. Education: Reviewed 'ABCs' of diabetes management (respective goals in parentheses):  A1C (<7), blood pressure (<130/80), and cholesterol (LDL <100). 3. Compliance at present is estimated to be excellent. Efforts to improve compliance (if necessary) will be directed at Continue present diet and exercise regimen.. 4. Follow up: 4 months Apparently his cardiologist mentioned Feest and famine type of regimen.  I explained that I was more in favor of more frequent but smaller meals.  Encouraged him to continue with his diet and exercise regimen which seems to be working. I then discussed the hemorrhoids and recommended fluids, bulk in diet and exercise to keep him regular.  He has trouble he will call me. The umbilical hernia is causing very little  difficulty and explained that that can be dealt with at anytime if it starts to give him more trouble.  He was comfortable with that.

## 2020-11-19 ENCOUNTER — Encounter: Payer: Self-pay | Admitting: Nurse Practitioner

## 2020-12-19 ENCOUNTER — Other Ambulatory Visit: Payer: Self-pay

## 2020-12-19 ENCOUNTER — Encounter: Payer: Self-pay | Admitting: Nurse Practitioner

## 2020-12-19 ENCOUNTER — Ambulatory Visit: Payer: BC Managed Care – PPO | Admitting: Nurse Practitioner

## 2020-12-19 ENCOUNTER — Telehealth: Payer: Self-pay | Admitting: *Deleted

## 2020-12-19 VITALS — BP 126/70 | HR 64 | Ht 70.5 in | Wt 227.4 lb

## 2020-12-19 DIAGNOSIS — Z1211 Encounter for screening for malignant neoplasm of colon: Secondary | ICD-10-CM

## 2020-12-19 DIAGNOSIS — Z7901 Long term (current) use of anticoagulants: Secondary | ICD-10-CM | POA: Diagnosis not present

## 2020-12-19 MED ORDER — NA SULFATE-K SULFATE-MG SULF 17.5-3.13-1.6 GM/177ML PO SOLN: 1.0000 | Freq: Once | ORAL | 0 refills | Status: AC

## 2020-12-19 NOTE — Patient Instructions (Signed)
You will be contacted by our office prior to your procedure for directions on holding your Plavix.  If you do not hear from our office 1 week prior to your scheduled procedure, please call [phone] to discuss.   You have been scheduled for a colonoscopy. Please follow written instructions given to you at your visit today.  Please pick up your prep supplies at the pharmacy within the next 1-3 days. If you use inhalers (even only as needed), please bring them with you on the day of your procedure.  If you are age 35 or older, your body mass index should be between 23-30. Your Body mass index is 32.16 kg/m. If this is out of the aforementioned range listed, please consider follow up with your Primary Care Provider.  If you are age 25 or younger, your body mass index should be between 19-25. Your Body mass index is 32.16 kg/m. If this is out of the aformentioned range listed, please consider follow up with your Primary Care Provider.   __________________________________________________________  The [facility] providers would like to encourage you to use [facility] to communicate with providers for non-urgent requests or questions.  Due to long hold times on the telephone, sending your provider a message by [facility] may be a faster and more efficient way to get a response.  Please allow 48 business hours for a response.  Please remember that this is for non-urgent requests.

## 2020-12-19 NOTE — Telephone Encounter (Signed)
Reviewed    OK to hold Plavix and probably at this point would discontinue   Keep on ASA

## 2020-12-19 NOTE — Telephone Encounter (Signed)
[facility] Pre-operative Risk Assessment     Request for surgical clearance:     Endoscopy Procedure  What type of surgery is being performed?     Colonoscopy  When is this surgery scheduled?     Monday 03/02/21  What type of clearance is required ?   Pharmacy  Are there any medications that need to be held prior to surgery and how long? Plavix 5 days  Practice name and name of physician performing surgery?      [facility]  What is your office phone and fax number?      Phone- [phone]  [phone]  Anesthesia type (None, local, MAC, general) ?       MAC

## 2020-12-19 NOTE — Progress Notes (Signed)
ASSESSMENT AND PLAN    #60 year old male for colon cancer screening.  Negative Cologuard October 2019.  Since then his brother was diagnosed with colon cancer( age 55).  Patient has no concerning features such as blood in stool, bowel changes or anemia.  --The risks and benefits of colonoscopy with possible polypectomy / biopsies were discussed and the patient agrees to proceed.  #Probable GERD.  Presented to the hospital 2019 with chest pain.  Heart cath showed nonobstructive CAD.  Patient started on famotidine, no chest pain since  #Diabetes.  A1c improving with weight loss  # CAD / NSTEMI in 2015, s/p DES on Plavix.  --Hold Plavix for 5 days before procedure - will instruct when and how to resume after procedure. Patient understands that there is a low but real risk of cardiovascular event such as heart attack, stroke, or embolism /  thrombosis, or ischemia while off Plavix. The patient consents to proceed. Will communicate by phone or EMR with patient's prescribing provider to confirm that holding Plavix is reasonable in this case.   HISTORY OF PRESENT ILLNESS     Chief Complaint : Colon cancer screening.   Jonathan Lindsey is a 60 y.o. male , new to the practice and referred by PCP for colon cancer screening.  Patient has a past medical history significant for CAD / STEMI s/p DES in 2015, probable GERD,obesity, diabetes, hyperlipidemia, appendectomy.  See PMH below for any additional history.   Jonathan Lindsey had a negative Cologuard in October 2019.  His brother has since been diagnosed with colorectal cancer (around age 69).  Jonathan Lindsey has no GI symptoms of colon cancer such as bowel changes, blood in stool.  He is not anemic.  He has had voluntary weight loss trying to manage diabetes.    Data Reviewed: January 2022 hemoglobin 16.7  Past Medical History:  Diagnosis Date  . CAD (coronary artery disease), native coronary artery    a. STEMI 10/2013 s/p PTCA & DES to pLAD b. Cath 03/2018  showed patent stent   . Eye abnormalities    left eye - no lens  . Myocardial infarction [facility]) 2015     Past Surgical History:  Procedure Laterality Date  . APPENDECTOMY    . INGUINAL HERNIA REPAIR Bilateral 1990, 2014   NOT SURE ??rih WITH MESH  . LEFT HEART CATH AND CORONARY ANGIOGRAPHY N/A 03/28/2018   Procedure: LEFT HEART CATH AND CORONARY ANGIOGRAPHY;  Surgeon: Martinique, Peter M, MD;  Location: [facility];  Service: Cardiovascular;  Laterality: N/A;  . LEFT HEART CATHETERIZATION WITH CORONARY ANGIOGRAM N/A 11/05/2013   Procedure: LEFT HEART CATHETERIZATION WITH CORONARY ANGIOGRAM;  Surgeon: Clent Demark, MD;  Location: [facility];  Service: Cardiovascular;  Laterality: N/A;  . RETINAL DETACHMENT SURGERY Left 2003   EYE DETACHMENT RETINA AND BUCKLES  . SCLERAL BUCKLE PROCEDURE Right 2003   Family History  Problem Relation Age of Onset  . Diabetes Mother   . Hyperlipidemia Mother   . Hypertension Mother   . Breast cancer Mother   . Multiple myeloma Father   . Colon cancer Brother 40  . Heart attack Maternal Grandfather   . Prostate cancer Brother    Social History   Tobacco Use  . Smoking status: Never Smoker  . Smokeless tobacco: Never Used  Vaping Use  . Vaping Use: Never used  Substance Use Topics  . Alcohol use: Yes    Comment: 4 per day  . Drug use:  No   Current Outpatient Medications  Medication Sig Dispense Refill  . acetaminophen (TYLENOL) 325 MG tablet Take 325-650 mg by mouth every 6 (six) hours as needed (for pain or headaches).    . carvedilol (COREG) 12.5 MG tablet Take 1 tablet (12.5 mg total) by mouth 2 (two) times daily with a meal. 60 tablet 11  . clopidogrel (PLAVIX) 75 MG tablet Take 1 tablet (75 mg total) by mouth daily. 90 tablet 1  . CVS ASPIRIN LOW DOSE 81 MG EC tablet TAKE 1 TABLET BY MOUTH EVERY DAY 30 tablet 0  . diphenhydrAMINE (BENADRYL) 25 mg capsule Take 25 mg by mouth every 6 (six) hours as needed (for any potential allergic  reactions).    . dorzolamide-timolol (COSOPT) 22.3-6.8 MG/ML ophthalmic solution Place 1 drop into the left eye 2 (two) times daily.    . famotidine (PEPCID) 10 MG tablet Take 1 tablet (10 mg total) by mouth 2 (two) times daily. 60 tablet 1  . glucose blood test strip Use as instructed to check blood sugar one time a day 100 each 12  . Multiple Vitamins-Minerals (EYE VITAMINS PO) Take 1 tablet by mouth daily.    . Na Sulfate-K Sulfate-Mg Sulf 17.5-3.13-1.6 GM/177ML SOLN Take 1 kit by mouth once for 1 dose. 354 mL 0  . Omega-3 Fatty Acids (FISH OIL) 1000 MG CAPS Take 1,000 mg by mouth daily.     Glory Rosebush Delica Lancets 16X MISC 1 each by Does not apply route daily. 100 each 4  . ramipril (ALTACE) 2.5 MG capsule Take 1 capsule (2.5 mg total) by mouth daily. 90 capsule 1  . rosuvastatin (CRESTOR) 40 MG tablet Take 1 tablet (40 mg total) by mouth daily. 90 tablet 1  . nitroGLYCERIN (NITROSTAT) 0.4 MG SL tablet Place 1 tablet (0.4 mg total) under the tongue every 5 (five) minutes x 3 doses as needed for chest pain. (Patient not taking: Reported on 12/19/2020) 25 tablet 3   No current facility-administered medications for this visit.   No Known Allergies   Review of Systems:  All systems reviewed and negative except where noted in HPI.    PHYSICAL EXAM :    Wt Readings from Last 3 Encounters:  12/19/20 227 lb 6 oz (103.1 kg)  08/12/20 244 lb (110.7 kg)  07/08/20 247 lb 3.2 oz (112.1 kg)    BP 126/70 (BP Location: Left Arm, Patient Position: Sitting, Cuff Size: Normal)   Pulse 64   Ht 5' 10.5" (1.791 m) Comment: height measured without shoes  Wt 227 lb 6 oz (103.1 kg)   BMI 32.16 kg/m  Constitutional:  Pleasant male in no acute distress. Psychiatric: Normal mood and affect. Behavior is normal. EENT: Pupils normal.  Conjunctivae are normal. No scleral icterus. Neck supple.  Cardiovascular: Normal rate, regular rhythm. No edema Pulmonary/chest: Effort normal and breath sounds normal.  No wheezing, rales or rhonchi. Abdominal: Soft, nondistended, nontender. Bowel sounds active throughout. There are no masses palpable. No hepatomegaly. Neurological: Alert and oriented to person place and time. Skin: Skin is warm and dry. No rashes noted.  Tye Savoy, NP  12/19/2020, 5:07 PM  Cc:  Referring Provider Denita Lung, MD

## 2020-12-19 NOTE — Telephone Encounter (Signed)
    [name] DOB:  March 16, 1961  MRN:  [id number]   Primary Cardiologist: Dorris Carnes, MD  Chart reviewed as part of pre-operative protocol coverage.  Jonathan Lindsey has a hx of CAd s/p acute anterolateral STEMI 10/2013 with PTCA/DES to LAD, HLD. Most recent ischemic eval 03/2018 with no significant obstructive CAD and patent LAD stent. He has been maintained on Plavix. Last seen 08/12/20 by Dr. Harrington Challenger doing well from cardiac perspective.   Will route to Dr. Harrington Challenger for recommendations regarding holding Plavix.   Loel Dubonnet, NP 12/19/2020, 5:01 PM

## 2020-12-22 NOTE — Telephone Encounter (Signed)
    Jonathan Lindsey DOB:  [birth year]  MRN:  [id number]   Primary Cardiologist: Dorris Carnes, MD  Chart reviewed as part of pre-operative protocol coverage. Given past medical history and time since last visit, based on ACC/AHA guidelines, [name] would be at acceptable risk for the planned procedure without further cardiovascular testing.   Per Dr. Harrington Challenger, patient may stop Plavix completely (spoke to patient and given instructions) and continue ASA as monotherapy. He may hold ASA prior to colonoscopy if needed and will resume ASA only in the post procedural setting.   The patient was advised that if he develops new symptoms prior to surgery to contact our office to arrange for a follow-up visit, and he verbalized understanding.  I will route this recommendation to the requesting party via Epic fax function and remove from pre-op pool.  Please call with questions.  Kathyrn Drown, NP 12/22/2020, 9:38 AM

## 2020-12-22 NOTE — Progress Notes (Signed)
I agree with the above note, plan 

## 2020-12-23 NOTE — Telephone Encounter (Signed)
I have left a message for patient to call back. 

## 2020-12-23 NOTE — Telephone Encounter (Signed)
I have spoken to patient to advise that cardiology has given Jonathan Lindsey the okay for him to hold his plavix prior to colonoscopy. Patient indicates that cardiology has also contacted him and told him to hold plavix indefinitely but continue ASA.

## 2021-03-02 ENCOUNTER — Other Ambulatory Visit: Payer: Self-pay

## 2021-03-02 ENCOUNTER — Ambulatory Visit (AMBULATORY_SURGERY_CENTER): Payer: BC Managed Care – PPO | Admitting: Gastroenterology

## 2021-03-02 ENCOUNTER — Encounter: Payer: Self-pay | Admitting: Gastroenterology

## 2021-03-02 VITALS — BP 138/88 | HR 72 | Temp 98.6°F | Resp 11 | Ht 70.5 in | Wt 227.0 lb

## 2021-03-02 DIAGNOSIS — K635 Polyp of colon: Secondary | ICD-10-CM | POA: Diagnosis not present

## 2021-03-02 DIAGNOSIS — Z1211 Encounter for screening for malignant neoplasm of colon: Secondary | ICD-10-CM | POA: Diagnosis not present

## 2021-03-02 DIAGNOSIS — D122 Benign neoplasm of ascending colon: Secondary | ICD-10-CM

## 2021-03-02 DIAGNOSIS — D125 Benign neoplasm of sigmoid colon: Secondary | ICD-10-CM | POA: Diagnosis not present

## 2021-03-02 MED ORDER — SODIUM CHLORIDE 0.9 % IV SOLN: 500.0000 mL | Freq: Once | INTRAVENOUS | Status: DC

## 2021-03-02 NOTE — Progress Notes (Signed)
Report given to PACU, vss 

## 2021-03-02 NOTE — Op Note (Signed)
[facility] Patient Name: Jonathan Lindsey Procedure Date: 03/02/2021 9:40 AM MRN: [id number] Endoscopist: Milus Banister , MD Age: 60 Referring MD:  Date of Birth: January 28, 1961 Gender: Male Account #: [id number] Procedure:                Colonoscopy Indications:              Screening for colorectal malignant neoplasm, 60 yo                            brother with colon cancer Medicines:                Monitored Anesthesia Care Procedure:                Pre-Anesthesia Assessment:                           - Prior to the procedure, a History and Physical                            was performed, and patient medications and                            allergies were reviewed. The patient's tolerance of                            previous anesthesia was also reviewed. The risks                            and benefits of the procedure and the sedation                            options and risks were discussed with the patient.                            All questions were answered, and informed consent                            was obtained. Prior Anticoagulants: The patient has                            taken no previous anticoagulant or antiplatelet                            agents. ASA Grade Assessment: II - A patient with                            mild systemic disease. After reviewing the risks                            and benefits, the patient was deemed in                            satisfactory condition to undergo the procedure.  After obtaining informed consent, the colonoscope                            was passed under direct vision. Throughout the                            procedure, the patient's blood pressure, pulse, and                            oxygen saturations were monitored continuously. The                            Colonoscope was introduced through the anus and                            advanced to the the cecum,  identified by                            appendiceal orifice and ileocecal valve. The                            colonoscopy was performed without difficulty. The                            patient tolerated the procedure well. The quality                            of the bowel preparation was good. The ileocecal                            valve, appendiceal orifice, and rectum were                            photographed. Scope In: 9:45:38 AM Scope Out: 9:58:26 AM Scope Withdrawal Time: 0 hours 10 minutes 13 seconds  Total Procedure Duration: 0 hours 12 minutes 48 seconds  Findings:                 Two sessile polyps were found in the ascending                            colon. The polyps were 2 to 3 mm in size. These                            polyps were removed with a cold snare. Resection                            and retrieval were complete.                           A 15 mm polyp was found in the sigmoid colon. The                            polyp was pedunculated. The polyp was removed  with                            a cold snare. Resection and retrieval were complete.                           The exam was otherwise without abnormality on                            direct and retroflexion views. Complications:            No immediate complications. Estimated blood loss:                            None. Estimated Blood Loss:     Estimated blood loss: none. Impression:               - Two 2 to 3 mm polyps in the ascending colon,                            removed with a cold snare. Resected and retrieved.                           - One 15 mm polyp in the sigmoid colon, removed                            with a cold snare. Resected and retrieved.                           - The examination was otherwise normal on direct                            and retroflexion views. Recommendation:           - Patient has a contact number available for                             emergencies. The signs and symptoms of potential                            delayed complications were discussed with the                            patient. Return to normal activities tomorrow.                            Written discharge instructions were provided to the                            patient.                           - Resume previous diet.                           - Continue present medications.                           -  Await pathology results. Milus Banister, MD 03/02/2021 10:04:19 AM This report has been signed electronically.

## 2021-03-02 NOTE — Progress Notes (Signed)
HPI: This is a man with FH of colon cancer   ROS: complete GI ROS as described in HPI, all other review negative.  Constitutional:  No unintentional weight loss   Past Medical History:  Diagnosis Date   CAD (coronary artery disease), native coronary artery    a. STEMI 10/2013 s/p PTCA & DES to pLAD b. Cath 03/2018 showed patent stent    Eye abnormalities    left eye - no lens   Myocardial infarction ([facility]) 2015    Past Surgical History:  Procedure Laterality Date   APPENDECTOMY     INGUINAL HERNIA REPAIR Bilateral 1990, 2014   NOT SURE ??rih WITH MESH   LEFT HEART CATH AND CORONARY ANGIOGRAPHY N/A 03/28/2018   Procedure: LEFT HEART CATH AND CORONARY ANGIOGRAPHY;  Surgeon: Martinique, Peter M, MD;  Location: [facility];  Service: Cardiovascular;  Laterality: N/A;   LEFT HEART CATHETERIZATION WITH CORONARY ANGIOGRAM N/A 11/05/2013   Procedure: LEFT HEART CATHETERIZATION WITH CORONARY ANGIOGRAM;  Surgeon: Clent Demark, MD;  Location: [facility];  Service: Cardiovascular;  Laterality: N/A;   RETINAL DETACHMENT SURGERY Left 2003   EYE DETACHMENT RETINA AND BUCKLES   SCLERAL BUCKLE PROCEDURE Right 2003    Current Outpatient Medications  Medication Sig Dispense Refill   acetaminophen (TYLENOL) 325 MG tablet Take 325-650 mg by mouth every 6 (six) hours as needed (for pain or headaches).     carvedilol (COREG) 12.5 MG tablet Take 1 tablet (12.5 mg total) by mouth 2 (two) times daily with a meal. 60 tablet 11   CVS ASPIRIN LOW DOSE 81 MG EC tablet TAKE 1 TABLET BY MOUTH EVERY DAY 30 tablet 0   diphenhydrAMINE (BENADRYL) 25 mg capsule Take 25 mg by mouth every 6 (six) hours as needed (for any potential allergic reactions).     dorzolamide-timolol (COSOPT) 22.3-6.8 MG/ML ophthalmic solution Place 1 drop into the left eye 2 (two) times daily.     famotidine (PEPCID) 10 MG tablet Take 1 tablet (10 mg total) by mouth 2 (two) times daily. 60 tablet 1   Multiple Vitamins-Minerals (EYE  VITAMINS PO) Take 1 tablet by mouth daily.     Omega-3 Fatty Acids (FISH OIL) 1000 MG CAPS Take 1,000 mg by mouth daily.      ramipril (ALTACE) 2.5 MG capsule Take 1 capsule (2.5 mg total) by mouth daily. 90 capsule 1   rosuvastatin (CRESTOR) 40 MG tablet Take 1 tablet (40 mg total) by mouth daily. 90 tablet 1   clopidogrel (PLAVIX) 75 MG tablet Take 1 tablet (75 mg total) by mouth daily. 90 tablet 1   glucose blood test strip Use as instructed to check blood sugar one time a day 100 each 12   nitroGLYCERIN (NITROSTAT) 0.4 MG SL tablet Place 1 tablet (0.4 mg total) under the tongue every 5 (five) minutes x 3 doses as needed for chest pain. (Patient not taking: No sig reported) 25 tablet 3   OneTouch Delica Lancets 46K MISC 1 each by Does not apply route daily. 100 each 4   Current Facility-Administered Medications  Medication Dose Route Frequency Provider Last Rate Last Admin   0.9 %  sodium chloride infusion  500 mL Intravenous Once Milus Banister, MD        Allergies as of 03/02/2021   (No Known Allergies)    Family History  Problem Relation Age of Onset   Diabetes Mother    Hyperlipidemia Mother    Hypertension Mother  Breast cancer Mother    Multiple myeloma Father    Colon cancer Brother 16   Heart attack Maternal Grandfather    Prostate cancer Brother     Social History   Socioeconomic History   Marital status: Married    Spouse name: Not on file   Number of children: 2   Years of education: Not on file   Highest education level: Not on file  Occupational History   Occupation: Accountant  Tobacco Use   Smoking status: Never   Smokeless tobacco: Never  Vaping Use   Vaping Use: Never used  Substance and Sexual Activity   Alcohol use: Yes    Comment: 4 per day   Drug use: No   Sexual activity: Yes  Other Topics Concern   Not on file  Social History Narrative   Not on file   Social Determinants of Health   Financial Resource Strain: Not on file  Food  Insecurity: Not on file  Transportation Needs: Not on file  Physical Activity: Not on file  Stress: Not on file  Social Connections: Not on file  Intimate Partner Violence: Not on file     Physical Exam: BP (!) 149/72   Pulse 74   Temp 98.6 F (37 C)   Ht 5' 10.5" (1.791 m)   Wt 227 lb (103 kg)   SpO2 96%   BMI 32.11 kg/m  Constitutional: generally well-appearing Psychiatric: alert and oriented x3 Abdomen: soft, nontender, nondistended, no obvious ascites, no peritoneal signs, normal bowel sounds No peripheral edema noted in lower extremities  Assessment and plan: 60 y.o. male with Fh of colon cancer (brother age 54)  Colonoscopy today  Please see the "Patient Instructions" section for addition details about the plan.  Owens Loffler, MD [facility] 03/02/2021, 9:35 AM

## 2021-03-02 NOTE — Progress Notes (Signed)
Called to room to assist during endoscopic procedure.  Patient ID and intended procedure confirmed with present staff. Received instructions for my participation in the procedure from the performing physician.  

## 2021-03-02 NOTE — Patient Instructions (Signed)
Handout provided on polyps.   YOU HAD AN ENDOSCOPIC PROCEDURE TODAY AT THE [facility]:   Refer to the procedure report that was given to you for any specific questions about what was found during the examination.  If the procedure report does not answer your questions, please call your gastroenterologist to clarify.  If you requested that your care partner not be given the details of your procedure findings, then the procedure report has been included in a sealed envelope for you to review at your convenience later.  YOU SHOULD EXPECT: Some feelings of bloating in the abdomen. Passage of more gas than usual.  Walking can help get rid of the air that was put into your GI tract during the procedure and reduce the bloating. If you had a lower endoscopy (such as a colonoscopy or flexible sigmoidoscopy) you may notice spotting of blood in your stool or on the toilet paper. If you underwent a bowel prep for your procedure, you may not have a normal bowel movement for a few days.  Please Note:  You might notice some irritation and congestion in your nose or some drainage.  This is from the oxygen used during your procedure.  There is no need for concern and it should clear up in a day or so.  SYMPTOMS TO REPORT IMMEDIATELY:  Following lower endoscopy (colonoscopy or flexible sigmoidoscopy):  Excessive amounts of blood in the stool  Significant tenderness or worsening of abdominal pains  Swelling of the abdomen that is new, acute  Fever of 100F or higher  For urgent or emergent issues, a gastroenterologist can be reached at any hour by calling [phone]. Do not use MyChart messaging for urgent concerns.    DIET:  We do recommend a small meal at first, but then you may proceed to your regular diet.  Drink plenty of fluids but you should avoid alcoholic beverages for 24 hours.  ACTIVITY:  You should plan to take it easy for the rest of today and you should NOT DRIVE or use heavy  machinery until tomorrow (because of the sedation medicines used during the test).    FOLLOW UP: Our staff will call the number listed on your records 48-72 hours following your procedure to check on you and address any questions or concerns that you may have regarding the information given to you following your procedure. If we do not reach you, we will leave a message.  We will attempt to reach you two times.  During this call, we will ask if you have developed any symptoms of COVID 19. If you develop any symptoms (ie: fever, flu-like symptoms, shortness of breath, cough etc.) before then, please call [phone].  If you test positive for Covid 19 in the 2 weeks post procedure, please call and report this information to us.    If any biopsies were taken you will be contacted by phone or by letter within the next 1-3 weeks.  Please call us at [phone] if you have not heard about the biopsies in 3 weeks.    SIGNATURES/CONFIDENTIALITY: You and/or your care partner have signed paperwork which will be entered into your electronic medical record.  These signatures attest to the fact that that the information above on your After Visit Summary has been reviewed and is understood.  Full responsibility of the confidentiality of this discharge information lies with you and/or your care-partner.  

## 2021-03-04 ENCOUNTER — Telehealth: Payer: Self-pay

## 2021-03-04 NOTE — Telephone Encounter (Signed)
  Follow up Call-  Call back number 03/02/2021  Post procedure Call Back phone  # [phone]  Permission to leave phone message Yes  Some recent data might be hidden     Patient questions:  Do you have a fever, pain , or abdominal swelling? No. Pain Score  0 *  Have you tolerated food without any problems? Yes.    Have you been able to return to your normal activities? Yes.    Do you have any questions about your discharge instructions: Diet   No. Medications  No. Follow up visit  No.  Do you have questions or concerns about your Care? No.  Actions: * If pain score is 4 or above: No action needed, pain <4.  Have you developed a fever since your procedure? NO  2.   Have you had an respiratory symptoms (SOB or cough) since your procedure? NO  3.   Have you tested positive for COVID 19 since your procedure NO  4.   Have you had any family members/close contacts diagnosed with the COVID 19 since your procedure?  NO   If yes to any of these questions please route to Joylene John, RN and Joella Prince, RN

## 2021-03-10 ENCOUNTER — Encounter: Payer: Self-pay | Admitting: Gastroenterology

## 2021-04-03 ENCOUNTER — Encounter: Payer: BC Managed Care – PPO | Admitting: Family Medicine

## 2021-04-07 LAB — HM DIABETES EYE EXAM

## 2021-04-14 ENCOUNTER — Other Ambulatory Visit: Payer: Self-pay

## 2021-04-14 ENCOUNTER — Encounter (HOSPITAL_COMMUNITY): Payer: Self-pay | Admitting: Emergency Medicine

## 2021-04-14 ENCOUNTER — Emergency Department (HOSPITAL_COMMUNITY)
Admission: EM | Admit: 2021-04-14 | Discharge: 2021-04-14 | Disposition: A | Discharge status: Home / Self Care | Payer: BC Managed Care – PPO | Attending: Emergency Medicine | Admitting: Emergency Medicine

## 2021-04-14 DIAGNOSIS — Z7902 Long term (current) use of antithrombotics/antiplatelets: Secondary | ICD-10-CM | POA: Diagnosis not present

## 2021-04-14 DIAGNOSIS — R040 Epistaxis: Secondary | ICD-10-CM | POA: Diagnosis present

## 2021-04-14 DIAGNOSIS — E119 Type 2 diabetes mellitus without complications: Secondary | ICD-10-CM | POA: Diagnosis not present

## 2021-04-14 DIAGNOSIS — I2510 Atherosclerotic heart disease of native coronary artery without angina pectoris: Secondary | ICD-10-CM | POA: Diagnosis not present

## 2021-04-14 DIAGNOSIS — Z79899 Other long term (current) drug therapy: Secondary | ICD-10-CM | POA: Diagnosis not present

## 2021-04-14 LAB — BASIC METABOLIC PANEL
Anion gap: 8 (ref 5–15)
BUN: 17 mg/dL (ref 6–20)
CO2: 24 mmol/L (ref 22–32)
Calcium: 9.4 mg/dL (ref 8.9–10.3)
Chloride: 102 mmol/L (ref 98–111)
Creatinine, Ser: 0.95 mg/dL (ref 0.61–1.24)
GFR, Estimated: >60 mL/min (ref >60)
Glucose, Bld: 130 mg/dL — ABNORMAL HIGH (ref 70–99)
Potassium: 5 mmol/L (ref 3.5–5.1)
Sodium: 134 mmol/L — ABNORMAL LOW (ref 135–145)

## 2021-04-14 LAB — CBC WITH DIFFERENTIAL/PLATELET
Abs Immature Granulocytes: 0.02 10*3/uL (ref 0.00–0.07)
Basophils Absolute: 0.1 10*3/uL (ref 0.0–0.1)
Basophils Relative: 1 %
Eosinophils Absolute: 0.2 10*3/uL (ref 0.0–0.5)
Eosinophils Relative: 3 %
HCT: 46.6 % (ref 39.0–52.0)
Hemoglobin: 16.3 g/dL (ref 13.0–17.0)
Immature Granulocytes: 0 %
Lymphocytes Relative: 28 %
Lymphs Abs: 1.6 10*3/uL (ref 0.7–4.0)
MCH: 33.2 pg (ref 26.0–34.0)
MCHC: 35 g/dL (ref 30.0–36.0)
MCV: 94.9 fL (ref 80.0–100.0)
Monocytes Absolute: 0.4 10*3/uL (ref 0.1–1.0)
Monocytes Relative: 7 %
Neutro Abs: 3.5 10*3/uL (ref 1.7–7.7)
Neutrophils Relative %: 61 %
Platelets: 190 10*3/uL (ref 150–400)
RBC: 4.91 MIL/uL (ref 4.22–5.81)
RDW: 11.5 % (ref 11.5–15.5)
WBC: 5.8 10*3/uL (ref 4.0–10.5)
nRBC: 0 % (ref 0.0–0.2)

## 2021-04-14 MED ORDER — OXYMETAZOLINE HCL 0.05 % NA SOLN
1.0000 | Freq: Once | NASAL | Status: DC
  Filled 2021-04-14: qty 30

## 2021-04-14 MED ORDER — LIDOCAINE-EPINEPHRINE 1 %-1:100000 IJ SOLN
20.0000 mL | Freq: Once | INTRAMUSCULAR | Status: AC
  Administered 2021-04-14: 20 mL
  Filled 2021-04-14: qty 1

## 2021-04-14 MED ORDER — LACTATED RINGERS IV BOLUS
1000.0000 mL | Freq: Once | INTRAVENOUS | Status: AC
  Administered 2021-04-14: 1000 mL via INTRAVENOUS

## 2021-04-14 MED ORDER — OXYMETAZOLINE HCL 0.05 % NA SOLN
1.0000 | Freq: Once | NASAL | Status: AC
  Administered 2021-04-14: 1 via NASAL
  Filled 2021-04-14: qty 30

## 2021-04-14 NOTE — ED Provider Notes (Signed)
[facility] EMERGENCY DEPARTMENT Provider Note   CSN: [id number] Arrival date & time: 04/14/21  1357     History Chief Complaint  Patient presents with   Epistaxis    Jonathan Lindsey is a 60 y.o. male who presents with concern for persistent epistaxis that started around 1230 today.  At time of arrival has been bleeding from the right nare for 1-1/2 hours.  States Jonathan Lindsey was sitting in his desk eating lunch when it started; and called EMS whenever Jonathan Lindsey did not stop for several minutes despite persistent pressure.  Afrin administered in route with small of the bleeding without resolution. History of nosebleeds in the past but never required packing.  States Jonathan Lindsey did require cauterization 5 years ago.  Jonathan Lindsey is taking his hypertensive medications today.  Jonathan Lindsey is going for anticoagulated with aspirin and Plavix.  Jonathan Lindsey is on baby aspirin daily but has not been on Plavix for 2 months, per cardiology.  History of coronary artery disease with stenting in 2014, diabetes.  HPI     Past Medical History:  Diagnosis Date   CAD (coronary artery disease), native coronary artery    a. STEMI 10/2013 s/p PTCA & DES to pLAD b. Cath 03/2018 showed patent stent    Eye abnormalities    left eye - no lens   Hypertension    Myocardial infarction [facility]) 2015   Neuromuscular disorder [facility])     Patient Active Problem List   Diagnosis Date Noted   Family history of colon cancer 86/57/8469   Umbilical hernia without obstruction and without gangrene 11/07/2020   History of hemorrhoids 11/07/2020   Onychomycosis 06/10/2020   Type 2 diabetes mellitus ([place]) 04/18/2018   Non-cardiac chest pain 03/28/2018   Gastroesophageal reflux disease    CAD (coronary artery disease), native coronary artery 11/05/2013   Inguinal hernia, left 10/03/2013   Obesity (BMI 30-39.9) 09/14/2013   Anisocoria 09/14/2013   Allergic rhinitis due to allergen 09/14/2013    Past Surgical History:  Procedure Laterality Date    APPENDECTOMY     INGUINAL HERNIA REPAIR Bilateral 1990, 2014   NOT SURE ??rih WITH MESH   LEFT HEART CATH AND CORONARY ANGIOGRAPHY N/A 03/28/2018   Procedure: LEFT HEART CATH AND CORONARY ANGIOGRAPHY;  Surgeon: Martinique, Peter M, MD;  Location: [facility];  Service: Cardiovascular;  Laterality: N/A;   LEFT HEART CATHETERIZATION WITH CORONARY ANGIOGRAM N/A 11/05/2013   Procedure: LEFT HEART CATHETERIZATION WITH CORONARY ANGIOGRAM;  Surgeon: Clent Demark, MD;  Location: [facility]ATH [facility];  Service: Cardiovascular;  Laterality: N/A;   RETINAL DETACHMENT SURGERY Left 2003   EYE DETACHMENT RETINA AND BUCKLES   SCLERAL BUCKLE PROCEDURE Right 2003       Family History  Problem Relation Age of Onset   Diabetes Mother    Hyperlipidemia Mother    Hypertension Mother    Breast cancer Mother    Multiple myeloma Father    Colon cancer Brother 60   Prostate cancer Brother    Heart attack Maternal Grandfather    Esophageal cancer Neg Hx    Stomach cancer Neg Hx    Rectal cancer Neg Hx     Social History   Tobacco Use   Smoking status: Never   Smokeless tobacco: Never  Vaping Use   Vaping Use: Never used  Substance Use Topics   Alcohol use: Yes    Comment: 4 per day   Drug use: No    Home Medications Prior to  Admission medications   Medication Sig Start Date End Date Taking? Authorizing Provider  acetaminophen (TYLENOL) 325 MG tablet Take 325-650 mg by mouth every 6 (six) hours as needed (for pain or headaches).    [provider]  carvedilol (COREG) 12.5 MG tablet Take 1 tablet (12.5 mg total) by mouth 2 (two) times daily with a meal. 08/13/20   Fay Records, MD  clopidogrel (PLAVIX) 75 MG tablet Take 1 tablet (75 mg total) by mouth daily. 10/23/20   Fay Records, MD  [facility] ASPIRIN LOW DOSE 81 MG EC tablet TAKE 1 TABLET BY MOUTH EVERY DAY 10/26/16   Fay Records, MD  diphenhydrAMINE (BENADRYL) 25 mg capsule Take 25 mg by mouth every 6 (six) hours as needed (for any  potential allergic reactions).    [provider]  dorzolamide-timolol (COSOPT) 22.3-6.8 MG/ML ophthalmic solution Place 1 drop into the left eye 2 (two) times daily. 02/09/19   [provider]  famotidine (PEPCID) 10 MG tablet Take 1 tablet (10 mg total) by mouth 2 (two) times daily. 03/28/18   Lyda Jester M, PA-C  glucose blood test strip Use as instructed to check blood sugar one time a day 07/22/20   Denita Lung, MD  Multiple Vitamins-Minerals (EYE VITAMINS PO) Take 1 tablet by mouth daily.    [provider]  nitroGLYCERIN (NITROSTAT) 0.4 MG SL tablet Place 1 tablet (0.4 mg total) under the tongue every 5 (five) minutes x 3 doses as needed for chest pain. Patient not taking: No sig reported 04/19/18   Bhagat, Crista Luria, PA  Omega-3 Fatty Acids (FISH OIL) 1000 MG CAPS Take 1,000 mg by mouth daily.     [provider]  OneTouch Delica Lancets 16X MISC 1 each by Does not apply route daily. 07/22/20   Denita Lung, MD  ramipril (ALTACE) 2.5 MG capsule Take 1 capsule (2.5 mg total) by mouth daily. 10/23/20   Fay Records, MD  rosuvastatin (CRESTOR) 40 MG tablet Take 1 tablet (40 mg total) by mouth daily. 10/23/20   Fay Records, MD    Allergies    Patient has no known allergies.  Review of Systems   Review of Systems  Constitutional: Negative.   HENT:  Positive for nosebleeds.   Respiratory: Negative.    Cardiovascular: Negative.   Gastrointestinal: Negative.   Genitourinary: Negative.   Musculoskeletal: Negative.   Neurological:  Positive for light-headedness and headaches. Negative for dizziness, tremors and weakness.   Physical Exam Updated Vital Signs BP (!) 151/89   Pulse 69   Temp (!) 97.4 F (36.3 C)   Resp 11   Ht 5' 10.5" (1.791 m)   Wt 99.8 kg   SpO2 96%   BMI 31.12 kg/m   Physical Exam Vitals and nursing note reviewed.  Constitutional:      Appearance: Jonathan Lindsey is obese. Jonathan Lindsey is not toxic-appearing.  HENT:     Head:  Normocephalic and atraumatic.     Nose:     Right Nostril: Epistaxis present.     Comments: Persistent epistaxis from the right naris, evaluation left nares with crusted dried blood in the naris without active bleeding.  Does appear to be an anterior bleed from the lateral nasal mucosa of the right naris      Mouth/Throat:     Mouth: Mucous membranes are moist.     Pharynx: Oropharynx is clear. Uvula midline. No oropharyngeal exudate or posterior oropharyngeal erythema.     Tonsils: No  tonsillar exudate.     Comments: Oropharyngeal exam revealed some blood streaked on the back of throat. Eyes:     General: Lids are normal. Vision grossly intact.        Right eye: No discharge.        Left eye: No discharge.     Extraocular Movements: Extraocular movements intact.     Conjunctiva/sclera: Conjunctivae normal.     Pupils: Pupils are equal, round, and reactive to light.  Neck:     Trachea: Trachea normal.  Cardiovascular:     Rate and Rhythm: Normal rate and regular rhythm.     Pulses: Normal pulses.     Heart sounds: Normal heart sounds. No murmur heard. Pulmonary:     Effort: Pulmonary effort is normal. No tachypnea, bradypnea, accessory muscle usage or respiratory distress.     Breath sounds: Normal breath sounds. No wheezing or rales.  Chest:     Chest wall: No mass, lacerations, deformity, swelling, tenderness, crepitus or edema.  Abdominal:     General: Bowel sounds are normal. There is no distension.     Palpations: Abdomen is soft.     Tenderness: There is no abdominal tenderness. There is no right CVA tenderness, left CVA tenderness, guarding or rebound.  Musculoskeletal:        General: No deformity.     Cervical back: Normal range of motion and neck supple. No edema, rigidity or crepitus. No pain with movement or spinous process tenderness.     Right lower leg: No edema.     Left lower leg: No edema.  Lymphadenopathy:     Cervical: No cervical adenopathy.  Skin:     General: Skin is warm and dry.     Capillary Refill: Capillary refill takes less than 2 seconds.  Neurological:     General: No focal deficit present.     Mental Status: Jonathan Lindsey is alert and oriented to person, place, and time. Mental status is at baseline.     GCS: GCS eye subscore is 4. GCS verbal subscore is 5. GCS motor subscore is 6.  Psychiatric:        Mood and Affect: Mood normal.    ED Results / Procedures / Treatments   Labs (all labs ordered are listed, but only abnormal results are displayed) Labs Reviewed  BASIC METABOLIC PANEL - Abnormal; Notable for the following components:      Result Value   Sodium 134 (*)    Glucose, Bld 130 (*)    All other components within normal limits  CBC WITH DIFFERENTIAL/PLATELET    EKG None  Radiology No results found.  Procedures .Epistaxis Management  Date/Time: 04/14/2021 7:22 PM Performed by: Emeline Darling, PA-C Authorized by: Emeline Darling, PA-C   Consent:    Consent obtained:  Verbal   Consent given by:  Patient   Risks, benefits, and alternatives were discussed: yes     Risks discussed:  Bleeding, infection, nasal injury and pain   Alternatives discussed:  No treatment, alternative treatment and observation Universal protocol:    Procedure explained and questions answered to patient or proxy's satisfaction: yes     Relevant documents present and verified: yes     Patient identity confirmed:  Verbally with patient Anesthesia:    Anesthesia method:  None Procedure details:    Treatment site:  R anterior   Treatment method:  Anterior pack   Treatment complexity:  Limited Post-procedure details:    Assessment:  Bleeding stopped  Procedure completion:  Tolerated Comments:     Patient administered Afrin, with slowing of the bleeding but persistent bleeding from the right nare.  Lidocaine with epi soaked pledgets were placed in the naris with persistent bleeding.  Subsequently patient underwent right  anterior packing with Rh[employer].  Jonathan Lindsey did subsequently sneezed forcefully several times with slight dislodgment of the [employer].  When reevaluated 20 minutes later, patient and his wife reported persistent sneezing and completely dislodged the [employer], however bleeding has not recurred.   Medications Ordered in ED Medications  lactated ringers bolus 1,000 mL (0 mLs Intravenous Stopped 04/14/21 1709)  lidocaine-EPINEPHrine (XYLOCAINE W/EPI) 1 %-1:100000 (with pres) injection 20 mL (20 mLs Other Given by Other 04/14/21 1445)  oxymetazoline (AFRIN) 0.05 % nasal spray 1 spray (1 spray Each Nare Given by Other 04/14/21 1445)    ED Course  I have reviewed the triage vital signs and the nursing notes.  Pertinent labs & imaging results that were available during my care of the patient were reviewed by me and considered in my medical decision making (see chart for details).    MDM Rules/Calculators/A&P                         60 year old male presents with concern for epistaxis refractory to Afrin and pressure.  Hypertensive on intake, vital signs otherwise normal.  On exam her pulmonary exam is normal patient is a bleeding persistently from the right nare, dried crusted blood in the nares bilaterally.  Hemotympanum bilaterally.  Patient treated as above with afrin and lidocaine with epi soaked pledgets without resolution of bleeding.  Was packed anteriorly as above, however became dislodged after sneezing.  Patient was observed for over 1 hour after removal of packing and ambulated around the emergency department without recurrence of his epistaxis.  Will discharge with Afrin and ENT follow-up.  No further work-up warranted at this time in the emergency department given reassuring laboratory studies with CBC revealing normal hemoglobin.  Vital signs remained normal throughout his stay in the emergency department and patient is comfortable with disposition plan.  Check voiced understanding his  medical evaluation and treatment plan.  Jonathan Lindsey was questions answered to his expressed satisfaction.  Strict return precautions are given.  Patient is well-appearing, stable, cooperative this time.  This chart was dictated using voice recognition software, Dragon. Despite the best efforts of this provider to proofread and correct errors, errors may still occur which can change documentation meaning.   Final Clinical Impression(s) / ED Diagnoses Final diagnoses:  Right-sided epistaxis    Rx / DC Orders ED Discharge Orders     None        Aura Dials 04/14/21 1927    Daleen Bo, MD 04/15/21 [phone]

## 2021-04-14 NOTE — ED Provider Notes (Signed)
I personally evaluated the patient during the encounter and completed a history, physical, procedures, medical decision making to contribute to the overall care of the patient and decision making for the patient briefly, the patient is a 60 y.o. male is here with nosebleed.  Bleeding from his right nare.  No signs of posterior bleed.  I have evaluated him after he is already had some nasal pledget with epinephrine and Afrin.  Bleeding has stopped now for about 2 hours.  He has had some congestion and dryness.  He is on aspirin but no other blood thinners.  Overall it appears a bleeding has stopped.  We will have him follow-up with ENT.  Discharged in good condition.  Denies any head trauma or nasal trauma.  This chart was dictated using voice recognition software.  Despite best efforts to proofread,  errors can occur which can change the documentation meaning.    EKG Interpretation None            Lennice Sites, DO 04/14/21 1740

## 2021-04-14 NOTE — ED Triage Notes (Signed)
Pt BIB [facility], pt has had nosebleed for 1 hr 15 mins, non-traumatic start, given 2 sprays Afrin in each nostril, pressure held entire time, bleeding has slowed but continues. Hx cauterization 5 years ago, HTN with meds, pt took meds this AM.  EMS VS BP 162/113, HR 73, 97% RA

## 2021-04-14 NOTE — ED Notes (Signed)
Pt verbalizes understanding of discharge instructions. Opportunity for questions and answers were provided. Pt discharged from the ED.   ?

## 2021-04-14 NOTE — Discharge Instructions (Addendum)
You are seen in the ER today for your nosebleed.  Fortunately you are not bleeding at this time.  Please take the Afrin and use it at home as needed for nosebleeds.  If you bleed again in your nose does not stop bleeding with Afrin and 10 to 15 minutes of constant pressure please return to the ER as you may require repacking of your nose.  Please see the information for the ear nose and throat provider listed below.  Return to the ER with any new or severe bleeding.

## 2021-04-29 ENCOUNTER — Other Ambulatory Visit: Payer: Self-pay | Admitting: Internal Medicine

## 2021-04-29 DIAGNOSIS — E782 Mixed hyperlipidemia: Secondary | ICD-10-CM

## 2021-04-29 DIAGNOSIS — E785 Hyperlipidemia, unspecified: Secondary | ICD-10-CM

## 2021-04-29 DIAGNOSIS — I2510 Atherosclerotic heart disease of native coronary artery without angina pectoris: Secondary | ICD-10-CM

## 2021-04-29 DIAGNOSIS — E1159 Type 2 diabetes mellitus with other circulatory complications: Secondary | ICD-10-CM

## 2021-06-15 ENCOUNTER — Encounter: Payer: Self-pay | Admitting: Family Medicine

## 2021-06-15 ENCOUNTER — Other Ambulatory Visit: Payer: Self-pay

## 2021-06-15 ENCOUNTER — Ambulatory Visit (INDEPENDENT_AMBULATORY_CARE_PROVIDER_SITE_OTHER): Payer: BC Managed Care – PPO | Admitting: Family Medicine

## 2021-06-15 VITALS — BP 128/76 | HR 65 | Temp 98.5°F | Ht 70.5 in | Wt 239.4 lb

## 2021-06-15 DIAGNOSIS — J309 Allergic rhinitis, unspecified: Secondary | ICD-10-CM

## 2021-06-15 DIAGNOSIS — N529 Male erectile dysfunction, unspecified: Secondary | ICD-10-CM

## 2021-06-15 DIAGNOSIS — K219 Gastro-esophageal reflux disease without esophagitis: Secondary | ICD-10-CM | POA: Diagnosis not present

## 2021-06-15 DIAGNOSIS — E785 Hyperlipidemia, unspecified: Secondary | ICD-10-CM

## 2021-06-15 DIAGNOSIS — Z0000 Encounter for general adult medical examination without abnormal findings: Secondary | ICD-10-CM | POA: Diagnosis not present

## 2021-06-15 DIAGNOSIS — I2510 Atherosclerotic heart disease of native coronary artery without angina pectoris: Secondary | ICD-10-CM

## 2021-06-15 DIAGNOSIS — E1169 Type 2 diabetes mellitus with other specified complication: Secondary | ICD-10-CM

## 2021-06-15 DIAGNOSIS — E669 Obesity, unspecified: Secondary | ICD-10-CM | POA: Diagnosis not present

## 2021-06-15 DIAGNOSIS — Z800 Family history of malignant neoplasm of digestive organs: Secondary | ICD-10-CM

## 2021-06-15 DIAGNOSIS — Z1159 Encounter for screening for other viral diseases: Secondary | ICD-10-CM

## 2021-06-15 DIAGNOSIS — D126 Benign neoplasm of colon, unspecified: Secondary | ICD-10-CM

## 2021-06-15 DIAGNOSIS — E1159 Type 2 diabetes mellitus with other circulatory complications: Secondary | ICD-10-CM | POA: Diagnosis not present

## 2021-06-15 LAB — POCT UA - MICROALBUMIN
Albumin/Creatinine Ratio, Urine, POC: 16.6
Creatinine, POC: 212.8 mg/dL
Microalbumin Ur, POC: 35.4 mg/L

## 2021-06-15 LAB — POCT GLYCOSYLATED HEMOGLOBIN (HGB A1C): Hemoglobin A1C: 6.2 % — AB (ref 4.0–5.6)

## 2021-06-15 MED ORDER — TADALAFIL 20 MG PO TABS: 20.0000 mg | ORAL_TABLET | Freq: Every day | ORAL | 3 refills | Status: AC | PRN

## 2021-06-15 MED ORDER — RAMIPRIL 2.5 MG PO CAPS: ORAL_CAPSULE | ORAL | 3 refills | Status: DC

## 2021-06-15 MED ORDER — ROSUVASTATIN CALCIUM 40 MG PO TABS: 40.0000 mg | ORAL_TABLET | Freq: Every day | ORAL | 3 refills | Status: DC

## 2021-06-15 NOTE — Progress Notes (Signed)
   Subjective:    Patient ID: Jonathan Lindsey, male    DOB: Apr 01, 1961, 60 y.o.   MRN: [id number]  HPI He is here for complete examination.  He does have a family history of colon cancer and has had a colonoscopy.  It did show evidence of a tubular adenoma.  He does have underlying coronary artery disease with a stent.  He continues to be followed by cardiology.  He has had no chest pain, PND or DOE.  He has not used his nitroglycerin.  His allergies are not causing any difficulty.  His reflux is under good control with Pepcid.  He has had some difficulty with erectile dysfunction and would like some help with that.  Continues on Crestor without difficulty.  Is also taking Altace and Coreg for his blood pressure.  His work and home life are going well.  Family and social history as well as health maintenance and immunizations was reviewed   Review of Systems  All other systems reviewed and are negative.     Objective:   Physical Exam Alert and in no distress. Tympanic membranes and canals are normal. Pharyngeal area is normal. Neck is supple without adenopathy or thyromegaly. Cardiac exam shows a regular sinus rhythm without murmurs or gallops. Lungs are clear to auscultation.  Abdominal exam shows no masses or tenderness with normal bowel sounds.  Diabetic foot exam is normal. Hemoglobin A1c is 6.2       Assessment & Plan:  Routine general medical examination at a health care facility - Plan: CBC with Differential/Platelet, Comprehensive metabolic panel, Lipid panel  Obesity (BMI 30-39.9) - Plan: CBC with Differential/Platelet, Comprehensive metabolic panel, Lipid panel  Allergic rhinitis, unspecified seasonality, unspecified trigger  Coronary artery disease involving native coronary artery of native heart without angina pectoris - Plan: rosuvastatin (CRESTOR) 40 MG tablet  Gastroesophageal reflux disease, unspecified whether esophagitis present  Type 2 diabetes mellitus with other  circulatory complication, without long-term current use of insulin (HCC) - Plan: CBC with Differential/Platelet, Comprehensive metabolic panel, POCT glycosylated hemoglobin (Hb A1C), Lipid panel, POCT UA - Microalbumin, ramipril (ALTACE) 2.5 MG capsule  Family history of colon cancer  Vasculogenic erectile dysfunction, unspecified vasculogenic erectile dysfunction type - Plan: tadalafil (CIALIS) 20 MG tablet  Need for hepatitis C screening test - Plan: Hepatitis C antibody  Hyperlipidemia associated with type 2 diabetes mellitus (HCC) - Plan: rosuvastatin (CRESTOR) 40 MG tablet  Tubular adenoma of colon Encouraged increased physical activity. Did recommend that he cut back on his Pepcid to see if he can still keep his symptoms under control He will continue to be followed by cardiology.  Follow-up with gastroenterology on a 3-year cycle. Discussed the use of Cialis with him and possible side effects.  He will keep me informed if they have any difficulty.  Explained cannot use this as well as nitroglycerin. Briefly discussed the potential use of an SGLT 2 type drug to help with his diabetes and his underlying coronary disease.  Suggested that he mention this to the cardiologist to get her input into this.

## 2021-06-16 LAB — COMPREHENSIVE METABOLIC PANEL
ALT: 45 IU/L — ABNORMAL HIGH (ref 0–44)
AST: 34 IU/L (ref 0–40)
Albumin/Globulin Ratio: 2.4 — ABNORMAL HIGH (ref 1.2–2.2)
Albumin: 5.1 g/dL — ABNORMAL HIGH (ref 3.8–4.8)
Alkaline Phosphatase: 61 IU/L (ref 44–121)
BUN/Creatinine Ratio: 16 (ref 10–24)
BUN: 16 mg/dL (ref 8–27)
Bilirubin Total: 0.9 mg/dL (ref 0.0–1.2)
CO2: 26 mmol/L (ref 20–29)
Calcium: 9.7 mg/dL (ref 8.6–10.2)
Chloride: 101 mmol/L (ref 96–106)
Creatinine, Ser: 1 mg/dL (ref 0.76–1.27)
Globulin, Total: 2.1 g/dL (ref 1.5–4.5)
Glucose: 109 mg/dL — ABNORMAL HIGH (ref 70–99)
Potassium: 4.7 mmol/L (ref 3.5–5.2)
Sodium: 142 mmol/L (ref 134–144)
Total Protein: 7.2 g/dL (ref 6.0–8.5)
eGFR: 86 mL/min/{1.73_m2} (ref >59)

## 2021-06-16 LAB — CBC WITH DIFFERENTIAL/PLATELET
Basophils Absolute: 0.1 10*3/uL (ref 0.0–0.2)
Basos: 1 %
EOS (ABSOLUTE): 0.2 10*3/uL (ref 0.0–0.4)
Eos: 3 %
Hematocrit: 48.8 % (ref 37.5–51.0)
Hemoglobin: 16.9 g/dL (ref 13.0–17.7)
Immature Grans (Abs): 0 10*3/uL (ref 0.0–0.1)
Immature Granulocytes: 0 %
Lymphocytes Absolute: 2.6 10*3/uL (ref 0.7–3.1)
Lymphs: 38 %
MCH: 33.1 pg — ABNORMAL HIGH (ref 26.6–33.0)
MCHC: 34.6 g/dL (ref 31.5–35.7)
MCV: 96 fL (ref 79–97)
Monocytes Absolute: 0.5 10*3/uL (ref 0.1–0.9)
Monocytes: 7 %
Neutrophils Absolute: 3.6 10*3/uL (ref 1.4–7.0)
Neutrophils: 51 %
Platelets: 187 10*3/uL (ref 150–450)
RBC: 5.11 x10E6/uL (ref 4.14–5.80)
RDW: 11.9 % (ref 11.6–15.4)
WBC: 7 10*3/uL (ref 3.4–10.8)

## 2021-06-16 LAB — LIPID PANEL
Chol/HDL Ratio: 4.3 ratio (ref 0.0–5.0)
Cholesterol, Total: 156 mg/dL (ref 100–199)
HDL: 36 mg/dL — ABNORMAL LOW (ref >39)
LDL Chol Calc (NIH): 81 mg/dL (ref 0–99)
Triglycerides: 232 mg/dL — ABNORMAL HIGH (ref 0–149)
VLDL Cholesterol Cal: 39 mg/dL (ref 5–40)

## 2021-06-16 LAB — HEPATITIS C ANTIBODY: Hep C Virus Ab: 0.2 s/co ratio (ref 0.0–0.9)

## 2021-07-21 ENCOUNTER — Encounter: Payer: Self-pay | Admitting: Family Medicine

## 2021-08-20 ENCOUNTER — Other Ambulatory Visit: Payer: Self-pay | Admitting: Internal Medicine

## 2021-09-02 ENCOUNTER — Other Ambulatory Visit: Payer: Self-pay | Admitting: Internal Medicine

## 2021-11-02 ENCOUNTER — Other Ambulatory Visit: Payer: Self-pay | Admitting: Internal Medicine

## 2021-11-26 ENCOUNTER — Other Ambulatory Visit: Payer: Self-pay | Admitting: Internal Medicine

## 2021-12-22 ENCOUNTER — Encounter: Payer: Self-pay | Admitting: Family Medicine

## 2021-12-22 ENCOUNTER — Ambulatory Visit: Payer: BC Managed Care – PPO | Admitting: Family Medicine

## 2021-12-22 VITALS — BP 130/76 | HR 67 | Temp 96.8°F | Wt 243.8 lb

## 2021-12-22 DIAGNOSIS — E785 Hyperlipidemia, unspecified: Secondary | ICD-10-CM | POA: Insufficient documentation

## 2021-12-22 DIAGNOSIS — I2510 Atherosclerotic heart disease of native coronary artery without angina pectoris: Secondary | ICD-10-CM

## 2021-12-22 DIAGNOSIS — N529 Male erectile dysfunction, unspecified: Secondary | ICD-10-CM | POA: Insufficient documentation

## 2021-12-22 DIAGNOSIS — E669 Obesity, unspecified: Secondary | ICD-10-CM | POA: Diagnosis not present

## 2021-12-22 DIAGNOSIS — E1159 Type 2 diabetes mellitus with other circulatory complications: Secondary | ICD-10-CM

## 2021-12-22 DIAGNOSIS — E1169 Type 2 diabetes mellitus with other specified complication: Secondary | ICD-10-CM | POA: Insufficient documentation

## 2021-12-22 DIAGNOSIS — K219 Gastro-esophageal reflux disease without esophagitis: Secondary | ICD-10-CM | POA: Diagnosis not present

## 2021-12-22 DIAGNOSIS — Z6379 Other stressful life events affecting family and household: Secondary | ICD-10-CM

## 2021-12-22 LAB — POCT GLYCOSYLATED HEMOGLOBIN (HGB A1C): Hemoglobin A1C: 7.2 % — AB (ref 4.0–5.6)

## 2021-12-22 MED ORDER — TRULICITY 0.75 MG/0.5ML ~~LOC~~ SOAJ: 0.7500 mg | SUBCUTANEOUS | 1 refills | Status: DC

## 2021-12-22 NOTE — Progress Notes (Unsigned)
Subjective:    Patient ID: Jonathan Lindsey, male    DOB: [birth year], 61 y.o.   MRN: [id number]  Jonathan Lindsey is a 61 y.o. male who presents for follow-up of Type 2 diabetes mellitus.  Patient is checking home blood sugars.   Home blood sugar records: BGs range between 128 and 146 How often is blood sugars being checked: few times a month. Current symptoms/problems include none and have been stable. Daily foot checks: yes   Any foot concerns: none  Last eye exam: Q year  Exercise:  staying active  His life has been disrupted since December when his mother-in-law was killed in a motor vehicle accident.  His father-in-law is now living with him until they can get him situated in a retirement center.  He has backed off on taking better care of himself because of this.  He does see cardiology regularly and has now stopped taking Plavix.  He continues on aspirin.  He uses Cialis for ED.  Reflux is causing very little difficulty and does occasionally use Pepcid.  Continues on Coreg and Altace. The following portions of the patient's history were reviewed and updated as appropriate: allergies, current medications, past medical history, past social history and problem list.  ROS as in subjective above.     Objective:    Physical Exam Alert and in no distress otherwise not examined.  Hemoglobin A1c is 7.2  Lab Review    Latest Ref Rng & Units 06/15/2021    4:34 PM 06/15/2021    4:31 PM 06/15/2021    4:18 PM 04/14/2021    2:40 PM 11/07/2020    8:42 AM  Diabetic Labs  HbA1c 4.0 - 5.6 % 6.2      5.8    Microalbumin mg/L  35.4       Micro/Creat Ratio   16.6       Chol 100 - 199 mg/dL   156      HDL >39 mg/dL   36      Calc LDL 0 - 99 mg/dL   81      Triglycerides 0 - 149 mg/dL   232      Creatinine 0.76 - 1.27 mg/dL   1.00   0.95         12/22/2021    8:17 AM 06/15/2021    3:29 PM 04/14/2021    5:00 PM 04/14/2021    4:00 PM 04/14/2021    3:00 PM  BP/Weight  Systolic BP 734 193  790 240 973  Diastolic BP 76 76 89 95 532  Wt. (Lbs) 243.8 239.4     BMI 34.49 kg/m2 33.86 kg/m2         Latest Ref Rng & Units 06/15/2021    3:15 PM 04/07/2021   12:00 AM  Foot/eye exam completion dates  Eye Exam No Retinopathy  No Retinopathy       Foot Form Completion  Done      This result is from an external source.    Jonathan Lindsey  reports that he has never smoked. He has never used smokeless tobacco. He reports current alcohol use. He reports that he does not use drugs.     Assessment & Plan:    Type 2 diabetes mellitus with other circulatory complication, without long-term current use of insulin (HCC) - Plan: POCT glycosylated hemoglobin (Hb A1C), Dulaglutide (TRULICITY) 9.92 EQ/6.8TM SOPN  Obesity (BMI 30-39.9)  Coronary artery disease involving native coronary artery of native heart without  angina pectoris  Gastroesophageal reflux disease, unspecified whether esophagitis present  Hyperlipidemia associated with type 2 diabetes mellitus ([place])  Vasculogenic erectile dysfunction, unspecified vasculogenic erectile dysfunction type  Stressful life event affecting family I discussed options considering the fact that his A1c is going up.  Encouraged him to start taking better care of himself in regard to diet and exercise.  Discussed SGLT2 versus GLP-1.  Explained that both of these can have a good cardiac benefit.  We have decided to go with Trulicity.  Discussed possible side effects of the medication.  He is to come back here if he needs help with injecting the medication otherwise I will see him in 4 months. I also talked about the stress that he is under with his father-in-law.  I think that he and his wife are handling this as best as possible.

## 2021-12-26 ENCOUNTER — Telehealth: Payer: Self-pay

## 2021-12-26 NOTE — Telephone Encounter (Signed)
P.A. TRULICITY 

## 2022-01-13 NOTE — Telephone Encounter (Signed)
P.A. approved til 12/26/22, pt picked up

## 2022-02-15 NOTE — Progress Notes (Unsigned)
Cardiology Office Note    Date:  02/17/2022   ID:  Jonathan Lindsey, DOB 09-06-1960, MRN [id number]  PCP:  Jonathan Lung, MD  Cardiologist: Dr. Harrington Lindsey  Chief Complaint: F/U of CAD   History of Present Illness:   Jonathan Lindsey is a 61 y.o. male with known CAD s/p acute anterolateral STEMI 10/2013, secondary to 100% occluded proximal LAD, treated with PTCA/DES placement and h/o HLD presents for follow up.   Admitted 03/2018 for chest/epigastric pain. Cath showed no significant obstructive CAD. The previously placed LAD stent was patent. LVEF and LVEDP both were normal. His CP was ruled noncardiac. Increased coreg for PVCs. Started on Pepcid for possible GI etiology of his pain.  I saw the pt in Jan 2022   Since seen he denies CP   Breathing is OK   He is fairly active  Building a deck      Trulicity added to meds earlier this ssummer      Diet: Breakfast Coffee Lunch leftovers from dinner before.  Dinner eats around 630.  Meat potatoes vegetables.    Past Medical History:  Diagnosis Date   CAD (coronary artery disease), native coronary artery    a. STEMI 10/2013 s/p PTCA & DES to pLAD b. Cath 03/2018 showed patent stent    Eye abnormalities    left eye - no lens   Hypertension    Myocardial infarction [facility]) 2015   Neuromuscular disorder (Jonathan Lindsey)     Past Surgical History:  Procedure Laterality Date   APPENDECTOMY     INGUINAL HERNIA REPAIR Bilateral 1990, 2014   NOT SURE ??rih WITH MESH   LEFT HEART CATH AND CORONARY ANGIOGRAPHY N/A 03/28/2018   Procedure: LEFT HEART CATH AND CORONARY ANGIOGRAPHY;  Surgeon: Martinique, Peter M, MD;  Location: [place] [facility];  Service: Cardiovascular;  Laterality: N/A;   LEFT HEART CATHETERIZATION WITH CORONARY ANGIOGRAM N/A 11/05/2013   Procedure: LEFT HEART CATHETERIZATION WITH CORONARY ANGIOGRAM;  Surgeon: Jonathan Demark, MD;  Location: [place] [facility];  Service: Cardiovascular;  Laterality: N/A;   RETINAL DETACHMENT SURGERY Left 2003   EYE  DETACHMENT RETINA AND BUCKLES   SCLERAL BUCKLE PROCEDURE Right 2003    Current Medications: Prior to Admission medications   Medication Sig Start Date End Date Taking? Authorizing Provider  acetaminophen (TYLENOL) 325 MG tablet Take 325-650 mg by mouth every 6 (six) hours as needed (for pain or headaches).    [provider]  carvedilol (COREG) 12.5 MG tablet Take 1 tablet (12.5 mg total) by mouth 2 (two) times daily with a meal. 03/28/18   Lyda Jester M, PA-C  clopidogrel (PLAVIX) 75 MG tablet TAKE 1 TABLET BY MOUTH EVERY DAY 12/26/17   Fay Records, MD  [facility] ASPIRIN LOW DOSE 81 MG EC tablet TAKE 1 TABLET BY MOUTH EVERY DAY 10/26/16   Fay Records, MD  diphenhydrAMINE (BENADRYL) 25 mg capsule Take 25 mg by mouth every 6 (six) hours as needed (for any potential allergic reactions).    [provider]  famotidine (PEPCID) 10 MG tablet Take 1 tablet (10 mg total) by mouth 2 (two) times daily. 03/28/18   Lyda Jester M, PA-C  Multiple Vitamins-Minerals (EYE VITAMINS PO) Take 1 tablet by mouth daily.    [provider]  nitroGLYCERIN (NITROSTAT) 0.4 MG SL tablet Place 1 tablet (0.4 mg total) under the tongue every 5 (five) minutes x 3 doses as needed for chest pain. 08/25/15   Dorris Carnes  V, MD  Omega-3 Fatty Acids (FISH OIL) 1000 MG CAPS Take 1,000 mg by mouth daily.     [provider]  ramipril (ALTACE) 2.5 MG capsule TAKE 1 CAPSULE (2.5 MG TOTAL) BY MOUTH DAILY. 11/21/17   Fay Records, MD  rosuvastatin (CRESTOR) 40 MG tablet Take 1 tablet (40 mg total) by mouth daily. 08/22/17   Fay Records, MD    Allergies:   Patient has no known allergies.   Social History   Socioeconomic History   Marital status: Married    Spouse name: Not on file   Number of children: 2   Years of education: Not on file   Highest education level: Not on file  Occupational History   Occupation: Accountant  Tobacco Use   Smoking status: Never   Smokeless tobacco: Never   Vaping Use   Vaping Use: Never used  Substance and Sexual Activity   Alcohol use: Yes    Comment: 4 per day   Drug use: No   Sexual activity: Yes  Other Topics Concern   Not on file  Social History Narrative   Not on file   Social Determinants of Health   Financial Resource Strain: Not on file  Food Insecurity: Not on file  Transportation Needs: Not on file  Physical Activity: Not on file  Stress: Not on file  Social Connections: Not on file     Family History:  The patient's family history includes Breast cancer in his mother; Colon cancer (age of onset: 47) in his brother; Diabetes in his mother; Heart attack in his maternal grandfather; Hyperlipidemia in his mother; Hypertension in his mother; Multiple myeloma in his father; Prostate cancer in his brother.   ROS:   Please see the history of present illness.    ROS All other systems reviewed and are negative.   PHYSICAL EXAM:   VS:  BP 132/80   Pulse 65   Ht 5' 10.5" (1.791 Lindsey)   Wt 237 lb 6.4 oz (107.7 kg)   SpO2 94%   BMI 33.58 kg/Lindsey    GEN: Obese 61 yo, in no acute distress  HEENT: normal  Neck: no JVD,  No  carotid bruits Cardiac: RRR; no murmur.    No LE edema   2+ pulses   Respiratory:  clear to auscultation bilaterally, GI: soft, nontender, nondistended, + BS MS: no deformity or atrophy  Skin: warm and dry, no rash Neuro:  Alert and Oriented x 3, Strength and sensation are intact Psych: euthymic mood, full affect  Wt Readings from Last 3 Encounters:  02/17/22 237 lb 6.4 oz (107.7 kg)  12/22/21 243 lb 12.8 oz (110.6 kg)  06/15/21 239 lb 6.4 oz (108.6 kg)      Studies/Labs Reviewed:   EKG:  EKG is ordered today.    NSR   66 bpm  First degree AV block  PR 206 msec  Recent Labs: 06/15/2021: ALT 45; BUN 16; Creatinine, Ser 1.00; Hemoglobin 16.9; Platelets 187; Potassium 4.7; Sodium 142   Lipid Panel    Component Value Date/Time   CHOL 156 06/15/2021 1618   TRIG 232 (H) 06/15/2021 1618   HDL 36  (L) 06/15/2021 1618   CHOLHDL 4.3 06/15/2021 1618   CHOLHDL 5.7 (H) 08/25/2015 0902   VLDL 35 (H) 08/25/2015 0902   LDLCALC 81 06/15/2021 1618    Additional studies/ records that were reviewed today include:   Diagnostic Left Heart Catheterization 03/28/18 Procedures    LEFT HEART CATH  AND CORONARY ANGIOGRAPHY  Conclusion      Previously placed Prox LAD to Mid LAD stent (unknown type) is widely patent. The left ventricular systolic function is normal. LV end diastolic pressure is normal. The left ventricular ejection fraction is 50-55% by visual estimate.   1. No significant obstructive CAD 2. Patent stent in the LAD 3. Normal LV function 4. Normal LVEDP   Plan: medical management.    Recommend Aspirin 43m daily for moderate CAD.  Coronary Diagrams    Diagnostic Diagram             ASSESSMENT & PLAN:   1.  Coronary artery disease.  Pt with remote intervention to LAD  Patent stent in 2018   Asymptomatic   Off of plavix now  Follow   Risk factor modification -. 2. HLD.  Patient continues on statin Crestor 40.  We will check lipomed, Lpa and Apo B today    3  HTN  Keep on current regimen.  Remains good.  Follow-up.  4.  Diabetes.  Will check A1C today   F/U for June 2024         Medication Adjustments/Labs and Tests Ordered: Current medicines are reviewed at length with the patient today.  Concerns regarding medicines are outlined above.  Medication changes, Labs and Tests ordered today are listed in the Patient Instructions below. Patient Instructions  Medication Instructions:   *If you need a refill on your cardiac medications before your next appointment, please call your pharmacy*   Lab Work:  NMR, APO B, LIPO A, HGBA1C  If you have labs (blood work) drawn today and your tests are completely normal, you will receive your results only by: [place](if you have MyChart) OR A paper copy in the mail If you have any lab test that is abnormal  or we need to change your treatment, we will call you to review the results.   Testing/Procedures:    Follow-Up: [facility] you and your health needs are our priority.  As part of our continuing mission to provide you with exceptional heart care, we have created designated Provider Care Teams.  These Care Teams include your primary Cardiologist (physician) and Advanced Practice Providers (APPs -  Physician Assistants and Nurse Practitioners) who all work together to provide you with the care you need, when you need it.  We recommend signing up for the patient portal called "MyChart".  Sign up information is provided on this After Visit Summary.  MyChart is used to connect with patients for Virtual Visits (Telemedicine).  Patients are able to view lab/test results, encounter notes, upcoming appointments, etc.  Non-urgent messages can be sent to your provider as well.   To learn more about what you can do with MyChart, go to [email or web address]    Your next appointment:   10 month(s)  The format for your next appointment:   In Person  Provider:   PDorris Carnes MD     Other Instructions   Important Information About Sugar         Signed, PDorris Carnes MD  02/17/2022 8:45 AM    [place] [place]  [id number]: [phone] Fax: [phone]

## 2022-02-16 ENCOUNTER — Other Ambulatory Visit: Payer: Self-pay | Admitting: Family Medicine

## 2022-02-16 ENCOUNTER — Other Ambulatory Visit: Payer: Self-pay | Admitting: Internal Medicine

## 2022-02-16 DIAGNOSIS — E1159 Type 2 diabetes mellitus with other circulatory complications: Secondary | ICD-10-CM

## 2022-02-17 ENCOUNTER — Ambulatory Visit: Payer: BC Managed Care – PPO | Admitting: Internal Medicine

## 2022-02-17 ENCOUNTER — Encounter: Payer: Self-pay | Admitting: Internal Medicine

## 2022-02-17 VITALS — BP 132/80 | HR 65 | Ht 70.5 in | Wt 237.4 lb

## 2022-02-17 DIAGNOSIS — E782 Mixed hyperlipidemia: Secondary | ICD-10-CM

## 2022-02-17 DIAGNOSIS — Z79899 Other long term (current) drug therapy: Secondary | ICD-10-CM | POA: Diagnosis not present

## 2022-02-17 DIAGNOSIS — I2510 Atherosclerotic heart disease of native coronary artery without angina pectoris: Secondary | ICD-10-CM

## 2022-02-17 NOTE — Patient Instructions (Signed)
Medication Instructions:   *If you need a refill on your cardiac medications before your next appointment, please call your pharmacy*   Lab Work:  NMR, APO B, LIPO A, HGBA1C  If you have labs (blood work) drawn today and your tests are completely normal, you will receive your results only by: [place] (if you have MyChart) OR A paper copy in the mail If you have any lab test that is abnormal or we need to change your treatment, we will call you to review the results.   Testing/Procedures:    Follow-Up: At [facility], you and your health needs are our priority.  As part of our continuing mission to provide you with exceptional heart care, we have created designated Provider Care Teams.  These Care Teams include your primary Cardiologist (physician) and Advanced Practice Providers (APPs -  Physician Assistants and Nurse Practitioners) who all work together to provide you with the care you need, when you need it.  We recommend signing up for the patient portal called "MyChart".  Sign up information is provided on this After Visit Summary.  MyChart is used to connect with patients for Virtual Visits (Telemedicine).  Patients are able to view lab/test results, encounter notes, upcoming appointments, etc.  Non-urgent messages can be sent to your provider as well.   To learn more about what you can do with MyChart, go to [email or web address].    Your next appointment:   10 month(s)  The format for your next appointment:   In Person  Provider:   Dorris Carnes, MD     Other Instructions   Important Information About Sugar

## 2022-02-18 LAB — HEMOGLOBIN A1C
Est. average glucose Bld gHb Est-mCnc: 120 mg/dL
Hgb A1c MFr Bld: 5.8 % — ABNORMAL HIGH (ref 4.8–5.6)

## 2022-02-18 LAB — LIPOPROTEIN A (LPA): Lipoprotein (a): 12 nmol/L (ref <75.0)

## 2022-02-18 LAB — NMR, LIPOPROFILE
Cholesterol, Total: 160 mg/dL (ref 100–199)
HDL Particle Number: 32.8 umol/L (ref >=30.5)
HDL-C: 33 mg/dL — ABNORMAL LOW (ref >39)
LDL Particle Number: 1327 nmol/L — ABNORMAL HIGH (ref <1000)
LDL Size: 19.9 nm — ABNORMAL LOW (ref >20.5)
LDL-C (NIH Calc): 101 mg/dL — ABNORMAL HIGH (ref 0–99)
LP-IR Score: 71 — ABNORMAL HIGH (ref <=45)
Small LDL Particle Number: 1002 nmol/L — ABNORMAL HIGH (ref <=527)
Triglycerides: 145 mg/dL (ref 0–149)

## 2022-02-18 LAB — APOLIPOPROTEIN B: Apolipoprotein B: 87 mg/dL (ref <90)

## 2022-02-19 ENCOUNTER — Telehealth: Payer: Self-pay

## 2022-02-19 DIAGNOSIS — I2510 Atherosclerotic heart disease of native coronary artery without angina pectoris: Secondary | ICD-10-CM

## 2022-02-19 DIAGNOSIS — E782 Mixed hyperlipidemia: Secondary | ICD-10-CM

## 2022-02-19 DIAGNOSIS — Z79899 Other long term (current) drug therapy: Secondary | ICD-10-CM

## 2022-02-19 NOTE — Telephone Encounter (Signed)
Referral to the [facility] placed. Will follow up with the pt.   LM for him to call back.

## 2022-02-19 NOTE — Telephone Encounter (Signed)
-----   Message from Fay Records, MD sent at 02/18/2022  7:17 PM EDT ----- LDL is 101    High particle number   With hx of CAD should be below 70 I would recomm adding REpatha to regimen   Check lipomed and liver panel in 8 wks Hgb A1C is better    5.8    Keep restricting carbs

## 2022-03-12 ENCOUNTER — Other Ambulatory Visit: Payer: Self-pay | Admitting: Internal Medicine

## 2022-03-24 ENCOUNTER — Encounter: Payer: Self-pay | Admitting: Internal Medicine

## 2022-03-30 ENCOUNTER — Ambulatory Visit: Payer: BC Managed Care – PPO

## 2022-04-21 ENCOUNTER — Other Ambulatory Visit: Payer: Self-pay | Admitting: Family Medicine

## 2022-04-21 DIAGNOSIS — E1159 Type 2 diabetes mellitus with other circulatory complications: Secondary | ICD-10-CM

## 2022-04-26 ENCOUNTER — Encounter: Payer: Self-pay | Admitting: Family Medicine

## 2022-04-26 ENCOUNTER — Ambulatory Visit: Payer: BC Managed Care – PPO | Admitting: Family Medicine

## 2022-04-26 VITALS — BP 122/72 | HR 66 | Temp 98.1°F | Ht 70.5 in | Wt 236.2 lb

## 2022-04-26 DIAGNOSIS — D126 Benign neoplasm of colon, unspecified: Secondary | ICD-10-CM | POA: Diagnosis not present

## 2022-04-26 DIAGNOSIS — E1169 Type 2 diabetes mellitus with other specified complication: Secondary | ICD-10-CM

## 2022-04-26 DIAGNOSIS — K219 Gastro-esophageal reflux disease without esophagitis: Secondary | ICD-10-CM

## 2022-04-26 DIAGNOSIS — Z0000 Encounter for general adult medical examination without abnormal findings: Secondary | ICD-10-CM

## 2022-04-26 DIAGNOSIS — I2510 Atherosclerotic heart disease of native coronary artery without angina pectoris: Secondary | ICD-10-CM | POA: Diagnosis not present

## 2022-04-26 DIAGNOSIS — N529 Male erectile dysfunction, unspecified: Secondary | ICD-10-CM

## 2022-04-26 DIAGNOSIS — J309 Allergic rhinitis, unspecified: Secondary | ICD-10-CM

## 2022-04-26 DIAGNOSIS — Z23 Encounter for immunization: Secondary | ICD-10-CM | POA: Diagnosis not present

## 2022-04-26 DIAGNOSIS — E669 Obesity, unspecified: Secondary | ICD-10-CM

## 2022-04-26 DIAGNOSIS — E1159 Type 2 diabetes mellitus with other circulatory complications: Secondary | ICD-10-CM

## 2022-04-26 DIAGNOSIS — E785 Hyperlipidemia, unspecified: Secondary | ICD-10-CM

## 2022-04-26 DIAGNOSIS — Z800 Family history of malignant neoplasm of digestive organs: Secondary | ICD-10-CM

## 2022-04-26 MED ORDER — TRULICITY 0.75 MG/0.5ML ~~LOC~~ SOAJ: SUBCUTANEOUS | 5 refills | Status: DC

## 2022-04-26 NOTE — Progress Notes (Signed)
Complete physical exam  Patient: Jonathan Lindsey   DOB: 01-Nov-1960   61 y.o. Male  MRN: [id number]  Subjective:    Chief Complaint  Patient presents with   Annual Exam    Fasting     Jonathan Lindsey is a 61 y.o. male who presents today for a complete physical exam. He has no particular concerns or questions.  He continues to be followed by cardiology for his underlying coronary artery disease and stents.  He is doing well on Coreg as well as Altace.  He also was taking Plavix.  Continues on Crestor without difficulty.  Does occasionally use Cialis but usually in small doses.  He does have underlying diabetes and did have a A1c in August of 5.8 on Trulicity.  His weight is down slightly.  Continues on Pepcid for his reflux disease.  He is scheduled for repeat colonoscopy in 2025.  His cardiologist did recent lipid panel on him and apparently is interested in getting him Repatha. Otherwise his family and social history as well as health maintenance and immunizations was reviewed.  Most recent fall risk assessment:    11/07/2020    8:25 AM  Fall Risk   Falls in the past year? 0  Number falls in past yr: 0  Injury with Fall? 0  Risk for fall due to : No Fall Risks  Follow up Falls evaluation completed     Most recent depression screenings:    04/26/2022    2:35 PM 11/07/2020    8:25 AM  PHQ 2/9 Scores  PHQ - 2 Score 0 0        Patient Care Team: Denita Lung, MD as PCP - General (Family Medicine) Fay Records, MD as PCP - Cardiology (Cardiology) Fay Records, MD as Consulting Physician (Cardiology)   Outpatient Medications Prior to Visit  Medication Sig Note   acetaminophen (TYLENOL) 325 MG tablet Take 325-650 mg by mouth every 6 (six) hours as needed (for pain or headaches). 04/26/2022: Prn last dose yesterday   carvedilol (COREG) 12.5 MG tablet TAKE 1 TABLET BY MOUTH TWICE A DAY WITH FOOD    [facility] ASPIRIN LOW DOSE 81 MG EC tablet TAKE 1 TABLET BY MOUTH EVERY DAY     diphenhydrAMINE (BENADRYL) 25 mg capsule Take 25 mg by mouth every 6 (six) hours as needed (for any potential allergic reactions). 04/26/2022: Rn last dose friday   dorzolamide-timolol (COSOPT) 22.3-6.8 MG/ML ophthalmic solution Place 1 drop into the left eye 2 (two) times daily.    famotidine (PEPCID) 10 MG tablet Take 1 tablet (10 mg total) by mouth 2 (two) times daily.    glucose blood test strip Use as instructed to check blood sugar one time a day    Multiple Vitamins-Minerals (EYE VITAMINS PO) Take 1 tablet by mouth daily.    Omega-3 Fatty Acids (FISH OIL) 1000 MG CAPS Take 1,000 mg by mouth daily.     OneTouch Delica Lancets 11H MISC 1 each by Does not apply route daily.    ramipril (ALTACE) 2.5 MG capsule TAKE 1 CAPSULE BY MOUTH EVERY DAY    rosuvastatin (CRESTOR) 40 MG tablet Take 1 tablet (40 mg total) by mouth daily.    tadalafil (CIALIS) 20 MG tablet Take 1 tablet (20 mg total) by mouth daily as needed for erectile dysfunction. 04/26/2022: Prn last dose a week ago   [DISCONTINUED] TRULICITY 4.17 EY/8.1KG SOPN INJECT 0.75 MG SUBCUTANEOUSLY ONE TIME PER WEEK 04/26/2022: Need refill  clopidogrel (PLAVIX) 75 MG tablet TAKE 1 TABLET BY MOUTH EVERY DAY (Patient not taking: Reported on 04/26/2022)    nitroGLYCERIN (NITROSTAT) 0.4 MG SL tablet Place 1 tablet (0.4 mg total) under the tongue every 5 (five) minutes x 3 doses as needed for chest pain. (Patient not taking: Reported on 04/26/2022)    No facility-administered medications prior to visit.    Review of Systems  All other systems reviewed and are negative.         Objective:     BP 122/72   Pulse 66   Temp 98.1 F (36.7 C)   Ht 5' 10.5" (1.791 m)   Wt 236 lb 3.2 oz (107.1 kg)   SpO2 94%   BMI 33.41 kg/m    Physical Exam  Alert and in no distress. Tympanic membranes and canals are normal. Pharyngeal area is normal. Neck is supple without adenopathy or thyromegaly. Cardiac exam shows a regular sinus rhythm without  murmurs or gallops. Lungs are clear to auscultation.      Assessment & Plan:    Routine general medical examination at a health care facility - Plan: CBC with Differential/Platelet, Comprehensive metabolic panel  Coronary artery disease involving native coronary artery of native heart without angina pectoris - Plan: CBC with Differential/Platelet, Comprehensive metabolic panel  Allergic rhinitis, unspecified seasonality, unspecified trigger  Gastroesophageal reflux disease, unspecified whether esophagitis present  Tubular adenoma of colon  Hyperlipidemia associated with type 2 diabetes mellitus (Jonathan Lindsey)  Type 2 diabetes mellitus with other circulatory complication, without long-term current use of insulin (Jonathan Lindsey) - Plan: CBC with Differential/Platelet, Comprehensive metabolic panel, Dulaglutide (TRULICITY) 3.97 QB/3.4LP SOPN  Vasculogenic erectile dysfunction, unspecified vasculogenic erectile dysfunction type  Obesity (BMI 30-39.9)  Family history of colon cancer  Need for influenza vaccination - Plan: Flu Vaccine QUAD 22moIM (Fluarix, Fluzone & Alfiuria Quad PF)  Need for COVID-19 vaccine - Plan: PFIZER Comirnaty(GRAY TOP)COVID-19 Vaccine  Immunization History  Administered Date(s) Administered   Influenza Inj Mdck Quad Pf 05/08/2017, 04/02/2018   Influenza Split 06/24/2016   Influenza,inj,Quad PF,6+ Mos 03/26/2019, 05/22/2021   Influenza-Unspecified 05/08/2017, 04/02/2018, 05/10/2019, 04/21/2020   PFIZER Comirnaty(Gray Top)Covid-19 Tri-Sucrose Vaccine 10/31/2020   PFIZER(Purple Top)SARS-COV-2 Vaccination 09/22/2019, 10/07/2019, 04/21/2020   Pfizer Covid-19 Vaccine Bivalent Booster 175yr& up 05/22/2021   Pneumococcal Conjugate-13 06/07/2017   Pneumococcal Polysaccharide-23 11/08/2013   Tdap 09/14/2013, 02/28/2020   Zoster Recombinat (Shingrix) 03/26/2019, 07/02/2019    Health Maintenance  Topic Date Due   COVID-19 Vaccine (6 - Pfizer risk series) 07/17/2021   INFLUENZA  VACCINE  02/16/2022   OPHTHALMOLOGY EXAM  04/07/2022   Diabetic kidney evaluation - GFR measurement  06/15/2022   Diabetic kidney evaluation - Urine ACR  06/15/2022   FOOT EXAM  06/15/2022   HEMOGLOBIN A1C  08/20/2022   TETANUS/TDAP  02/27/2030   COLONOSCOPY (Pts 45-4963yrnsurance coverage will need to be confirmed)  03/03/2031   Hepatitis C Screening  Completed   HIV Screening  Completed   Zoster Vaccines- Shingrix  Completed   HPV VACCINES  Aged Out   Fecal DNA (Cologuard)  Discontinued    Discussed health benefits of physical activity, and encouraged him to engage in regular exercise appropriate for his age and condition.  He will continue on his present medication regimen and follow-up with cardiology.  I did reinforce the benefit of getting the injection for his lipids.  I will keep him on the present dosing of Trulicity.  Recheck here 6 months.  Problem List Items Addressed This  Visit     Allergic rhinitis due to allergen   Coronary artery disease involving native coronary artery of native heart without angina pectoris   Relevant Orders   CBC with Differential/Platelet   Comprehensive metabolic panel   Family history of colon cancer   Gastroesophageal reflux disease   Hyperlipidemia associated with type 2 diabetes mellitus (HCC)   Relevant Medications   Dulaglutide (TRULICITY) 6.80 HO/1.2YQ SOPN   Obesity (BMI 30-39.9)   Relevant Medications   Dulaglutide (TRULICITY) 8.25 OI/3.7CW SOPN   Tubular adenoma of colon   Type 2 diabetes mellitus (HCC)   Relevant Medications   Dulaglutide (TRULICITY) 8.88 BV/6.9IH SOPN   Other Relevant Orders   CBC with Differential/Platelet   Comprehensive metabolic panel   Vasculogenic erectile dysfunction   Other Visit Diagnoses     Routine general medical examination at a health care facility    -  Primary   Relevant Orders   CBC with Differential/Platelet   Comprehensive metabolic panel   Need for influenza vaccination        Relevant Orders   Flu Vaccine QUAD 13moIM (Fluarix, Fluzone & Alfiuria Quad PF)   Need for COVID-19 vaccine       Relevant Orders   PFIZER Comirnaty(GRAY TOP)COVID-19 Vaccine      Return in about 6 months (around 10/26/2022) for daibetes and one year for cpe .     JJill Alexanders MD

## 2022-04-26 NOTE — Patient Instructions (Signed)
Health Maintenance, Male Adopting a healthy lifestyle and getting preventive care are important in promoting health and wellness. Ask your health care provider about: The right schedule for you to have regular tests and exams. Things you can do on your own to prevent diseases and keep yourself healthy. What should I know about diet, weight, and exercise? Eat a healthy diet  Eat a diet that includes plenty of vegetables, fruits, low-fat dairy products, and lean protein. Do not eat a lot of foods that are high in solid fats, added sugars, or sodium. Maintain a healthy weight Body mass index (BMI) is a measurement that can be used to identify possible weight problems. It estimates body fat based on height and weight. Your health care provider can help determine your BMI and help you achieve or maintain a healthy weight. Get regular exercise Get regular exercise. This is one of the most important things you can do for your health. Most adults should: Exercise for at least 150 minutes each week. The exercise should increase your heart rate and make you sweat (moderate-intensity exercise). Do strengthening exercises at least twice a week. This is in addition to the moderate-intensity exercise. Spend less time sitting. Even light physical activity can be beneficial. Watch cholesterol and blood lipids Have your blood tested for lipids and cholesterol at 62 years of age, then have this test every 5 years. You may need to have your cholesterol levels checked more often if: Your lipid or cholesterol levels are high. You are older than 62 years of age. You are at high risk for heart disease. What should I know about cancer screening? Many types of cancers can be detected early and may often be prevented. Depending on your health history and family history, you may need to have cancer screening at various ages. This may include screening for: Colorectal cancer. Prostate cancer. Skin cancer. Lung  cancer. What should I know about heart disease, diabetes, and high blood pressure? Blood pressure and heart disease High blood pressure causes heart disease and increases the risk of stroke. This is more likely to develop in people who have high blood pressure readings or are overweight. Talk with your health care provider about your target blood pressure readings. Have your blood pressure checked: Every 3-5 years if you are 18-39 years of age. Every year if you are 40 years old or older. If you are between the ages of 65 and 75 and are a current or former smoker, ask your health care provider if you should have a one-time screening for abdominal aortic aneurysm (AAA). Diabetes Have regular diabetes screenings. This checks your fasting blood sugar level. Have the screening done: Once every three years after age 45 if you are at a normal weight and have a low risk for diabetes. More often and at a younger age if you are overweight or have a high risk for diabetes. What should I know about preventing infection? Hepatitis B If you have a higher risk for hepatitis B, you should be screened for this virus. Talk with your health care provider to find out if you are at risk for hepatitis B infection. Hepatitis C Blood testing is recommended for: Everyone born from 1945 through 1965. Anyone with known risk factors for hepatitis C. Sexually transmitted infections (STIs) You should be screened each year for STIs, including gonorrhea and chlamydia, if: You are sexually active and are younger than 62 years of age. You are older than 62 years of age and your   health care provider tells you that you are at risk for this type of infection. Your sexual activity has changed since you were last screened, and you are at increased risk for chlamydia or gonorrhea. Ask your health care provider if you are at risk. Ask your health care provider about whether you are at high risk for HIV. Your health care provider  may recommend a prescription medicine to help prevent HIV infection. If you choose to take medicine to prevent HIV, you should first get tested for HIV. You should then be tested every 3 months for as long as you are taking the medicine. Follow these instructions at home: Alcohol use Do not drink alcohol if your health care provider tells you not to drink. If you drink alcohol: Limit how much you have to 0-2 drinks a day. Know how much alcohol is in your drink. In the [place] one drink equals one 12 oz bottle of beer (355 mL), one 5 oz glass of wine (148 mL), or one 1 oz glass of hard liquor (44 mL). Lifestyle Do not use any products that contain nicotine or tobacco. These products include cigarettes, chewing tobacco, and vaping devices, such as e-cigarettes. If you need help quitting, ask your health care provider. Do not use street drugs. Do not share needles. Ask your health care provider for help if you need support or information about quitting drugs. General instructions Schedule regular health, dental, and eye exams. Stay current with your vaccines. Tell your health care provider if: You often feel depressed. You have ever been abused or do not feel safe at home. Summary Adopting a healthy lifestyle and getting preventive care are important in promoting health and wellness. Follow your health care provider's instructions about healthy diet, exercising, and getting tested or screened for diseases. Follow your health care provider's instructions on monitoring your cholesterol and blood pressure. This information is not intended to replace advice given to you by your health care provider. Make sure you discuss any questions you have with your health care provider. Document Revised: 11/24/2020 Document Reviewed: 11/24/2020 Elsevier Patient Education  2023 Elsevier Inc.  

## 2022-04-28 ENCOUNTER — Encounter: Payer: Self-pay | Admitting: Pharmacist

## 2022-04-28 ENCOUNTER — Telehealth: Payer: Self-pay | Admitting: Pharmacist

## 2022-04-28 ENCOUNTER — Ambulatory Visit: Payer: BC Managed Care – PPO | Attending: Cardiology | Admitting: Pharmacist

## 2022-04-28 DIAGNOSIS — E785 Hyperlipidemia, unspecified: Secondary | ICD-10-CM | POA: Diagnosis not present

## 2022-04-28 DIAGNOSIS — E1169 Type 2 diabetes mellitus with other specified complication: Secondary | ICD-10-CM

## 2022-04-28 DIAGNOSIS — I2510 Atherosclerotic heart disease of native coronary artery without angina pectoris: Secondary | ICD-10-CM | POA: Diagnosis not present

## 2022-04-28 LAB — CBC WITH DIFFERENTIAL/PLATELET
Basophils Absolute: 0.1 10*3/uL (ref 0.0–0.2)
Basos: 1 %
EOS (ABSOLUTE): 0.1 10*3/uL (ref 0.0–0.4)
Eos: 2 %
Hematocrit: 48.1 % (ref 37.5–51.0)
Hemoglobin: 16.7 g/dL (ref 13.0–17.7)
Immature Grans (Abs): 0 10*3/uL (ref 0.0–0.1)
Immature Granulocytes: 0 %
Lymphocytes Absolute: 2.4 10*3/uL (ref 0.7–3.1)
Lymphs: 36 %
MCH: 34.1 pg — ABNORMAL HIGH (ref 26.6–33.0)
MCHC: 34.7 g/dL (ref 31.5–35.7)
MCV: 98 fL — ABNORMAL HIGH (ref 79–97)
Monocytes Absolute: 0.4 10*3/uL (ref 0.1–0.9)
Monocytes: 6 %
Neutrophils Absolute: 3.6 10*3/uL (ref 1.4–7.0)
Neutrophils: 55 %
Platelets: 216 10*3/uL (ref 150–450)
RBC: 4.9 x10E6/uL (ref 4.14–5.80)
RDW: 11.9 % (ref 11.6–15.4)
WBC: 6.6 10*3/uL (ref 3.4–10.8)

## 2022-04-28 LAB — COMPREHENSIVE METABOLIC PANEL
ALT: 33 IU/L (ref 0–44)
AST: 39 IU/L (ref 0–40)
Albumin/Globulin Ratio: 1.8 (ref 1.2–2.2)
Albumin: 4.8 g/dL (ref 3.9–4.9)
Alkaline Phosphatase: 47 IU/L (ref 44–121)
BUN/Creatinine Ratio: 22 (ref 10–24)
BUN: 22 mg/dL (ref 8–27)
Bilirubin Total: 0.7 mg/dL (ref 0.0–1.2)
CO2: 23 mmol/L (ref 20–29)
Calcium: 9.6 mg/dL (ref 8.6–10.2)
Chloride: 102 mmol/L (ref 96–106)
Creatinine, Ser: 1.02 mg/dL (ref 0.76–1.27)
Globulin, Total: 2.7 g/dL (ref 1.5–4.5)
Glucose: 105 mg/dL — ABNORMAL HIGH (ref 70–99)
Potassium: 4.8 mmol/L (ref 3.5–5.2)
Sodium: 143 mmol/L (ref 134–144)
Total Protein: 7.5 g/dL (ref 6.0–8.5)
eGFR: 84 mL/min/{1.73_m2} (ref >59)

## 2022-04-28 MED ORDER — REPATHA SURECLICK 140 MG/ML ~~LOC~~ SOAJ
1.0000 mL | SUBCUTANEOUS | 3 refills | Status: DC
Start: 2022-04-28 — End: 2023-03-01

## 2022-04-28 MED ORDER — NITROGLYCERIN 0.4 MG SL SUBL: 0.4000 mg | SUBLINGUAL_TABLET | SUBLINGUAL | 3 refills | Status: DC | PRN

## 2022-04-28 NOTE — Addendum Note (Signed)
Addended by: Rollen Sox on: 04/28/2022 11:01 AM   Modules accepted: Orders

## 2022-04-28 NOTE — Telephone Encounter (Signed)
PA approved through 04/28/23

## 2022-04-28 NOTE — Telephone Encounter (Signed)
PA for Repatha submitted.  Key: [id number]

## 2022-04-28 NOTE — Progress Notes (Signed)
Patient ID: Jonathan Lindsey                 DOB: 29-Mar-1961                    MRN: [id number]     HPI: Jonathan Lindsey is a 61 y.o. male patient referred to lipid clinic by Dr Harrington Challenger. PMH is significant for CAD, STEMI, HLD, obesity and T2DM. Pt had 100% occluded LAD. Currently managed on rosuvastatin '40mg'$  daily.  Patient presents today to discuss lipid levels. Reports compliance with rosuvastatin. Denies CP, has not refilled nitro in many years.  Reports diet is "ok." Drinks alcohol occasionally.  Had OV with PCP yesterday who recommended stating Repatha. 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   Current Medications:  Rosuvastatin '40mg'$  daily  Intolerances: N/A  Risk Factors:  CAD T2DM  LDL goal: <55  Labs: LPA 12.0, apoB 87, LDL particle # 1327, LDL 100, HDL 33, trigs 145, TC 160  (02/18/22)  Past Medical History:  Diagnosis Date   CAD (coronary artery disease), native coronary artery    a. STEMI 10/2013 s/p PTCA & DES to pLAD b. Cath 03/2018 showed patent stent    Eye abnormalities    left eye - no lens   Hypertension    Myocardial infarction [facility]) 2015   Neuromuscular disorder ([place])     Current Outpatient Medications on File Prior to Visit  Medication Sig Dispense Refill   acetaminophen (TYLENOL) 325 MG tablet Take 325-650 mg by mouth every 6 (six) hours as needed (for pain or headaches).     carvedilol (COREG) 12.5 MG tablet TAKE 1 TABLET BY MOUTH TWICE A DAY WITH FOOD 180 tablet 3   clopidogrel (PLAVIX) 75 MG tablet TAKE 1 TABLET BY MOUTH EVERY DAY (Patient not taking: Reported on 04/26/2022) 90 tablet 0   [facility] ASPIRIN LOW DOSE 81 MG EC tablet TAKE 1 TABLET BY MOUTH EVERY DAY 30 tablet 0   diphenhydrAMINE (BENADRYL) 25 mg capsule Take 25 mg by mouth every 6 (six) hours as needed (for any potential allergic reactions).     dorzolamide-timolol (COSOPT) 22.3-6.8 MG/ML ophthalmic solution Place 1 drop into the left eye 2 (two) times daily.     Dulaglutide  (TRULICITY) 9.50 DT/2.6ZT SOPN INJECT 0.75 MG SUBCUTANEOUSLY ONE TIME PER WEEK 2 mL 5   famotidine (PEPCID) 10 MG tablet Take 1 tablet (10 mg total) by mouth 2 (two) times daily. 60 tablet 1   glucose blood test strip Use as instructed to check blood sugar one time a day 100 each 12   Multiple Vitamins-Minerals (EYE VITAMINS PO) Take 1 tablet by mouth daily.     nitroGLYCERIN (NITROSTAT) 0.4 MG SL tablet Place 1 tablet (0.4 mg total) under the tongue every 5 (five) minutes x 3 doses as needed for chest pain. (Patient not taking: Reported on 04/26/2022) 25 tablet 3   Omega-3 Fatty Acids (FISH OIL) 1000 MG CAPS Take 1,000 mg by mouth daily.      OneTouch Delica Lancets 24P MISC 1 each by Does not apply route daily. 100 each 4   ramipril (ALTACE) 2.5 MG capsule TAKE 1 CAPSULE BY MOUTH EVERY DAY 90 capsule 3   rosuvastatin (CRESTOR) 40 MG tablet Take 1 tablet (40 mg total) by mouth daily. 90 tablet 3   tadalafil (CIALIS) 20 MG tablet Take 1 tablet (20 mg total) by mouth daily as needed for erectile dysfunction. 30 tablet 3  No current facility-administered medications on file prior to visit.    No Known Allergies  Assessment/Plan:  1. Hyperlipidemia - Patient LDL 100 which is above goal of <55. Aggressive goal due to history of T2DM and CAD. Will continue rosuvastatin '40mg'$  daily and recommend addition of Repatha.  Using demo pen, educated patient on mechanism of action, storage, site selection, administration, and possible adverse effects. Patient voiced understanding. Will complete PA and contact patient when approved. Gave 30 day free trial card and encouraged him to download copay card from manufacturers website.  Recheck lipid panel in 2-3 months.  Continue rosuvastatin '40mg'$  daily Start Repatha '140mg'$  q 2 weeks Recheck lipid panel in 2-03 months  Karren Cobble, PharmD, BCACP, [place], [place] [address] [place], [place] [address] Phone: [phone]; Fax:  [phone] 04/28/2022 9:00 AM

## 2022-04-28 NOTE — Patient Instructions (Signed)
It was nice meeting you today  We would like your LDL (bad cholesterol) to be less than 55  Please continue your rosuvastatin '40mg'$  once daily  We would like to start a new medication called Repatha which you would inject once every 2 weeks  I will complete the prior authorization for you and contact you when it is approved  Once we start the medication we would like to recheck your cholesterol levels in about 2-3 months  I recommend going to [email or web address] and signing up for the savings card  Karren Cobble, PharmD, BCACP, [place], [place] [address] [place], [place] [address] Phone: [phone]; Fax: [phone] 04/28/2022 8:45 AM

## 2022-05-10 ENCOUNTER — Encounter: Payer: Self-pay | Admitting: Internal Medicine

## 2022-09-05 ENCOUNTER — Other Ambulatory Visit: Payer: Self-pay | Admitting: Family Medicine

## 2022-09-05 DIAGNOSIS — I2510 Atherosclerotic heart disease of native coronary artery without angina pectoris: Secondary | ICD-10-CM

## 2022-09-05 DIAGNOSIS — E1169 Type 2 diabetes mellitus with other specified complication: Secondary | ICD-10-CM

## 2022-09-05 DIAGNOSIS — E1159 Type 2 diabetes mellitus with other circulatory complications: Secondary | ICD-10-CM

## 2022-10-07 ENCOUNTER — Other Ambulatory Visit: Payer: Self-pay | Admitting: Family Medicine

## 2022-10-07 DIAGNOSIS — E1159 Type 2 diabetes mellitus with other circulatory complications: Secondary | ICD-10-CM

## 2022-10-26 ENCOUNTER — Encounter: Payer: Self-pay | Admitting: Family Medicine

## 2022-10-26 ENCOUNTER — Ambulatory Visit: Payer: BC Managed Care – PPO | Admitting: Family Medicine

## 2022-10-26 VITALS — BP 130/82 | HR 72 | Temp 98.1°F | Resp 16 | Wt 246.8 lb

## 2022-10-26 DIAGNOSIS — I2510 Atherosclerotic heart disease of native coronary artery without angina pectoris: Secondary | ICD-10-CM

## 2022-10-26 DIAGNOSIS — Z800 Family history of malignant neoplasm of digestive organs: Secondary | ICD-10-CM

## 2022-10-26 DIAGNOSIS — E669 Obesity, unspecified: Secondary | ICD-10-CM

## 2022-10-26 DIAGNOSIS — E1169 Type 2 diabetes mellitus with other specified complication: Secondary | ICD-10-CM | POA: Diagnosis not present

## 2022-10-26 DIAGNOSIS — E1159 Type 2 diabetes mellitus with other circulatory complications: Secondary | ICD-10-CM | POA: Diagnosis not present

## 2022-10-26 DIAGNOSIS — Z8669 Personal history of other diseases of the nervous system and sense organs: Secondary | ICD-10-CM

## 2022-10-26 DIAGNOSIS — D126 Benign neoplasm of colon, unspecified: Secondary | ICD-10-CM

## 2022-10-26 DIAGNOSIS — E785 Hyperlipidemia, unspecified: Secondary | ICD-10-CM

## 2022-10-26 LAB — POCT GLYCOSYLATED HEMOGLOBIN (HGB A1C): Hemoglobin A1C: 6.9 % — AB (ref 4.0–5.6)

## 2022-10-26 MED ORDER — TRULICITY 1.5 MG/0.5ML ~~LOC~~ SOAJ: 1.5000 mg | SUBCUTANEOUS | 1 refills | Status: DC

## 2022-10-26 NOTE — Progress Notes (Signed)
Subjective:    Patient ID: Jonathan Lindsey, male    DOB: 1960/10/25, 62 y.o.   MRN: [id number]  JEFFRE WINDECKER is a 62 y.o. male who presents for follow-up of Type 2 diabetes mellitus.  Home blood sugar records:  blood sugars not checked at home Current symptoms/problems include none and have been stable. Daily foot checks: yes   Any foot concerns: no How often blood sugars checked: Exercise: The patient does not participate in regular exercise at present.  Recently he has kept physically active doing some home renovation. Diet: regular Medications were reviewed.  He is not taking Repatha because of the cost.  He is taking Crestor.  He continues on Altace and Coreg.  He is also taking Trulicity but has not seen any weight change.  He sees ophthalmology regularly.  He is scheduled for follow-up concerning his colonoscopy due to polyps as well as a family history. The following portions of the patient's history were reviewed and updated as appropriate: allergies, current medications, past medical history, past social history and problem list.  ROS as in subjective above.     Objective:    Physical Exam Alert and in no distress otherwise not examined. Hemoglobin A1c is 6.9 Blood pressure 130/82, pulse 72, temperature 98.1 F (36.7 C), temperature source Oral, resp. rate 16, weight 246 lb 12.8 oz (111.9 kg), SpO2 97 %.  Lab Review    Latest Ref Rng & Units 04/26/2022    3:49 PM 02/17/2022    8:48 AM 12/22/2021   12:23 PM 06/15/2021    4:34 PM 06/15/2021    4:31 PM  Diabetic Labs  HbA1c 4.8 - 5.6 %  5.8  7.2  6.2    Microalbumin mg/L     35.4   Micro/Creat Ratio      16.6   Triglycerides 0 - 149 mg/dL  469      Creatinine 6.29 - 1.27 mg/dL 5.28           10/18/3242    9:05 AM 04/26/2022    2:40 PM 02/17/2022    8:23 AM 12/22/2021    8:17 AM 06/15/2021    3:29 PM  BP/Weight  Systolic BP 130 122 132 130 128  Diastolic BP 82 72 80 76 76  Wt. (Lbs) 246.8 236.2 237.4 243.8 239.4   BMI 34.91 kg/m2 33.41 kg/m2 33.58 kg/m2 34.49 kg/m2 33.86 kg/m2      Latest Ref Rng & Units 06/15/2021    3:15 PM 04/07/2021   12:00 AM  Foot/eye exam completion dates  Eye Exam No Retinopathy  No Retinopathy      Foot Form Completion  Done      This result is from an external source.    Sylar  reports that he has never smoked. He has never used smokeless tobacco. He reports current alcohol use. He reports that he does not use drugs.     Assessment & Plan:    Type 2 diabetes mellitus with other circulatory complication, without long-term current use of insulin - Plan: POCT glycosylated hemoglobin (Hb A1C)  Coronary artery disease involving native coronary artery of native heart without angina pectoris  Hyperlipidemia associated with type 2 diabetes mellitus  Obesity (BMI 30-39.9)  I will increase his Trulicity to 1.5.  Explained we can go a lot higher if we need to.  Also discussed the possibility of giving him a SGLT2 medication.  Did recommend he talk to the pharmacist through cardiology concerning the use of  Praluent and cost of that.  Recheck here in 4 months.

## 2023-02-06 ENCOUNTER — Other Ambulatory Visit: Payer: Self-pay

## 2023-02-06 ENCOUNTER — Encounter (HOSPITAL_BASED_OUTPATIENT_CLINIC_OR_DEPARTMENT_OTHER): Payer: Self-pay

## 2023-02-06 DIAGNOSIS — Z79899 Other long term (current) drug therapy: Secondary | ICD-10-CM | POA: Diagnosis not present

## 2023-02-06 DIAGNOSIS — E119 Type 2 diabetes mellitus without complications: Secondary | ICD-10-CM | POA: Diagnosis not present

## 2023-02-06 DIAGNOSIS — T63481A Toxic effect of venom of other arthropod, accidental (unintentional), initial encounter: Secondary | ICD-10-CM | POA: Diagnosis not present

## 2023-02-06 DIAGNOSIS — Z7985 Long-term (current) use of injectable non-insulin antidiabetic drugs: Secondary | ICD-10-CM | POA: Diagnosis not present

## 2023-02-06 DIAGNOSIS — Z7982 Long term (current) use of aspirin: Secondary | ICD-10-CM | POA: Diagnosis not present

## 2023-02-06 NOTE — ED Triage Notes (Signed)
Patient here POV from Home.  Endorses being stung by a wasp or a bee to his Right Forehead. This occurred at 1700. Applied ice and took 50 mg of benadryl as well.  No SOB. No Itchy Throat. Swelling to Right Eye and facial Area.   NAD Noted during Triage. A&Ox4. Gcs 15. Ambulatory.

## 2023-02-07 ENCOUNTER — Emergency Department (HOSPITAL_BASED_OUTPATIENT_CLINIC_OR_DEPARTMENT_OTHER)
Admission: EM | Admit: 2023-02-07 | Discharge: 2023-02-07 | Disposition: A | Discharge status: Home / Self Care | Payer: BC Managed Care – PPO | Attending: Emergency Medicine | Admitting: Emergency Medicine

## 2023-02-07 DIAGNOSIS — T63481A Toxic effect of venom of other arthropod, accidental (unintentional), initial encounter: Secondary | ICD-10-CM

## 2023-02-07 DIAGNOSIS — R600 Localized edema: Secondary | ICD-10-CM

## 2023-02-07 MED ORDER — PREDNISONE 10 MG (21) PO TBPK: ORAL_TABLET | ORAL | 0 refills | Status: DC

## 2023-02-07 MED ORDER — PREDNISONE 50 MG PO TABS
60.0000 mg | ORAL_TABLET | Freq: Once | ORAL | Status: AC
  Administered 2023-02-07: 60 mg via ORAL
  Filled 2023-02-07: qty 1

## 2023-02-07 NOTE — ED Notes (Signed)
Pt. Here after being stung by a bee at 5pm. Took 50mg  benadryl, c/o swelling to right forehead and under right eye

## 2023-02-07 NOTE — ED Provider Notes (Signed)
[place] EMERGENCY DEPARTMENT AT [facility]  Provider Note  CSN: [id number] Arrival date & time: 02/06/23 2105  History Chief Complaint  Patient presents with   Insect Bite    Jonathan Lindsey is a 62 y.o. male reports he was stung by a wasp or bee on the R temple around 1700hrs today, he has had swelling to that side of his face since then, no difficulty breathing and no rash. He took some benadryl at home prior to coming to the ED. He was recently started on Trulicity for DM, does not check his glucose at home regularly. He also reports he had his lens removed from his L eye for prior retinal detachment and is concerned that the swelling on the right side will endanger his "good eye"   Home Medications Prior to Admission medications   Medication Sig Start Date End Date Taking? Authorizing Provider  predniSONE (STERAPRED UNI-PAK 21 TAB) 10 MG (21) TBPK tablet 10mg  Tabs, 6 day taper. Use as directed 02/07/23  Yes Pollyann Savoy, MD  acetaminophen (TYLENOL) 325 MG tablet Take 325-650 mg by mouth every 6 (six) hours as needed (for pain or headaches).    [provider]  carvedilol (COREG) 12.5 MG tablet TAKE 1 TABLET BY MOUTH TWICE A DAY WITH FOOD 03/12/22   Pricilla Riffle, MD  CVS ASPIRIN LOW DOSE 81 MG EC tablet TAKE 1 TABLET BY MOUTH EVERY DAY 10/26/16   Pricilla Riffle, MD  diphenhydrAMINE (BENADRYL) 25 mg capsule Take 25 mg by mouth every 6 (six) hours as needed (for any potential allergic reactions).    [provider]  dorzolamide-timolol (COSOPT) 22.3-6.8 MG/ML ophthalmic solution Place 1 drop into the left eye 2 (two) times daily. 02/09/19   [provider]  Dulaglutide (TRULICITY) 1.5 MG/0.5ML SOPN Inject 1.5 mg into the skin once a week. 10/26/22   Ronnald Nian, MD  Evolocumab (REPATHA SURECLICK) 140 MG/ML SOAJ Inject 140 mg into the skin every 14 (fourteen) days. 04/28/22   Pricilla Riffle, MD  famotidine (PEPCID) 10 MG tablet Take 1 tablet  (10 mg total) by mouth 2 (two) times daily. 03/28/18   Robbie Lis M, PA-C  glucose blood test strip Use as instructed to check blood sugar one time a day 07/22/20   Ronnald Nian, MD  Multiple Vitamins-Minerals (EYE VITAMINS PO) Take 1 tablet by mouth daily.    [provider]  nitroGLYCERIN (NITROSTAT) 0.4 MG SL tablet Place 1 tablet (0.4 mg total) under the tongue every 5 (five) minutes x 3 doses as needed for chest pain. 04/28/22   Pricilla Riffle, MD  Omega-3 Fatty Acids (FISH OIL) 1000 MG CAPS Take 1,000 mg by mouth daily.     [provider]  OneTouch Delica Lancets 33G MISC 1 each by Does not apply route daily. 07/22/20   Ronnald Nian, MD  ramipril (ALTACE) 2.5 MG capsule TAKE 1 CAPSULE BY MOUTH EVERY DAY 09/06/22   Ronnald Nian, MD  rosuvastatin (CRESTOR) 40 MG tablet TAKE 1 TABLET BY MOUTH EVERY DAY 09/06/22   Ronnald Nian, MD  tadalafil (CIALIS) 20 MG tablet Take 1 tablet (20 mg total) by mouth daily as needed for erectile dysfunction. 06/15/21   Ronnald Nian, MD     Allergies    Patient has no known allergies.   Review of Systems   Review of Systems Please see HPI for pertinent positives and negatives  Physical Exam BP Marland Kitchen)  147/85 (BP Location: Left Arm)   Pulse 72   Temp 98.1 F (36.7 C) (Oral)   Resp 18   Ht 5\' 10"  (1.778 m)   Wt 104.3 kg   SpO2 98%   BMI 33.00 kg/m   Physical Exam Vitals and nursing note reviewed.  Constitutional:      Appearance: Normal appearance.  HENT:     Head: Normocephalic and atraumatic.     Nose: Nose normal.     Mouth/Throat:     Mouth: Mucous membranes are moist.  Eyes:     Extraocular Movements: Extraocular movements intact.     Conjunctiva/sclera: Conjunctivae normal.     Comments: Mild right periorbital edema, globe is unaffected  Cardiovascular:     Rate and Rhythm: Normal rate.  Pulmonary:     Effort: Pulmonary effort is normal.     Breath sounds: Normal breath sounds. No stridor. No  wheezing.  Abdominal:     General: Abdomen is flat.     Palpations: Abdomen is soft.     Tenderness: There is no abdominal tenderness.  Musculoskeletal:        General: No swelling. Normal range of motion.     Cervical back: Neck supple.  Skin:    General: Skin is warm and dry.  Neurological:     General: No focal deficit present.     Mental Status: He is alert.  Psychiatric:        Mood and Affect: Mood normal.     ED Results / Procedures / Treatments   EKG None  Procedures Procedures  Medications Ordered in the ED Medications  predniSONE (DELTASONE) tablet 60 mg (has no administration in time range)    Initial Impression and Plan  Patient well appearing with localized reaction to an insect sting. No signs of systemic involvement. Reassured his R eye will be fine. Recommend he continue with benadryl, add a course of prednisone. Encouraged to watch his glucose closely while taking prednisone. PCP follow up, RTED for any other concerns.    ED Course       MDM Rules/Calculators/A&P Medical Decision Making Problems Addressed: Insect stings, accidental or unintentional, initial encounter: acute illness or injury Periorbital edema of right eye: acute illness or injury  Risk Prescription drug management.     Final Clinical Impression(s) / ED Diagnoses Final diagnoses:  Insect stings, accidental or unintentional, initial encounter  Periorbital edema of right eye    Rx / DC Orders ED Discharge Orders          Ordered    predniSONE (STERAPRED UNI-PAK 21 TAB) 10 MG (21) TBPK tablet        02/07/23 0115             Pollyann Savoy, MD 02/07/23 [phone]

## 2023-03-01 ENCOUNTER — Ambulatory Visit (INDEPENDENT_AMBULATORY_CARE_PROVIDER_SITE_OTHER): Payer: BC Managed Care – PPO | Admitting: Family Medicine

## 2023-03-01 ENCOUNTER — Encounter: Payer: Self-pay | Admitting: Family Medicine

## 2023-03-01 VITALS — BP 138/82 | HR 75 | Temp 98.0°F | Resp 14 | Wt 244.8 lb

## 2023-03-01 DIAGNOSIS — E1159 Type 2 diabetes mellitus with other circulatory complications: Secondary | ICD-10-CM | POA: Insufficient documentation

## 2023-03-01 DIAGNOSIS — I152 Hypertension secondary to endocrine disorders: Secondary | ICD-10-CM

## 2023-03-01 DIAGNOSIS — E785 Hyperlipidemia, unspecified: Secondary | ICD-10-CM

## 2023-03-01 DIAGNOSIS — E669 Obesity, unspecified: Secondary | ICD-10-CM

## 2023-03-01 DIAGNOSIS — E1169 Type 2 diabetes mellitus with other specified complication: Secondary | ICD-10-CM

## 2023-03-01 DIAGNOSIS — I2510 Atherosclerotic heart disease of native coronary artery without angina pectoris: Secondary | ICD-10-CM | POA: Diagnosis not present

## 2023-03-01 LAB — POCT GLYCOSYLATED HEMOGLOBIN (HGB A1C): Hemoglobin A1C: 7.2 % — AB (ref 4.0–5.6)

## 2023-03-01 MED ORDER — TRULICITY 3 MG/0.5ML ~~LOC~~ SOAJ
3.0000 mg | SUBCUTANEOUS | 0 refills | Status: DC
Start: 2023-03-01 — End: 2023-08-10

## 2023-03-01 NOTE — Progress Notes (Signed)
  Subjective:    Patient ID: Jonathan Lindsey, male    DOB: [birth year], 62 y.o.   MRN: [id number]  Jonathan Lindsey is a 62 y.o. male who presents for follow-up of Type 2 diabetes mellitus.  Home blood sugar records:  blood sugars are not checked Current symptoms/problems include paresthesia of the feet and have been stable. Daily foot checks: yes   Any foot concerns: no How often blood sugars checked: never Exercise: Home exercise routine includes walking . Diet: regular He is on Trulicity 1.5 and seems to be tolerating this fairly well.  He continues on Coreg and Altace and is having no difficulty with that.  He is not taking Repatha due to cost of the medication.  He was seen by cardiology approximately 1 year ago. The following portions of the patient's history were reviewed and updated as appropriate: allergies, current medications, past medical history, past social history and problem list.  ROS as in subjective above.     Objective:    Physical Exam Alert and in no distress otherwise not examined. Hemoglobin A1c is 7.2 Blood pressure 138/82, pulse 75, temperature 98 F (36.7 C), temperature source Oral, resp. rate 14, weight 244 lb 12.8 oz (111 kg), SpO2 95%.  Lab Review    Latest Ref Rng & Units 03/01/2023    9:28 AM 10/26/2022    9:18 AM 04/26/2022    3:49 PM 02/17/2022    8:48 AM 12/22/2021   12:23 PM  Diabetic Labs  HbA1c 4.0 - 5.6 % 7.2  6.9   5.8  7.2   Triglycerides 0 - 149 mg/dL    409    Creatinine 8.11 - 1.27 mg/dL   9.14         7/82/9562    9:11 AM 02/07/2023    1:30 AM 02/07/2023    1:15 AM 02/07/2023    1:00 AM 02/07/2023   12:45 AM  BP/Weight  Systolic BP 138 143 145 141 144  Diastolic BP 82 87 86 85 90  Wt. (Lbs) 244.8      BMI 35.13 kg/m2          Latest Ref Rng & Units 06/15/2021    3:15 PM 04/07/2021   12:00 AM  Foot/eye exam completion dates  Eye Exam No Retinopathy  No Retinopathy      Foot Form Completion  Done      This result is from an  external source.    Reiss  reports that he has never smoked. He has never used smokeless tobacco. He reports current alcohol use of about 4.0 standard drinks of alcohol per week. He reports that he does not use drugs.     Assessment & Plan:    Type 2 diabetes mellitus with other circulatory complication, without long-term current use of insulin (HCC) - Plan: POCT glycosylated hemoglobin (Hb A1C), Dulaglutide (TRULICITY) 3 MG/0.5ML SOPN  Coronary artery disease involving native coronary artery of native heart without angina pectoris  Obesity (BMI 30-39.9)  Hyperlipidemia associated with type 2 diabetes mellitus (HCC)  Hypertension associated with diabetes (HCC) I will increase his Trulicity to 3 mg.  He will use up the lower strength of Trulicity and take 2 shots per week until that is run out.  Then recheck here in about 1 month.  He has a follow-up with cardiology concerning his hyperlipidemia.

## 2023-03-08 ENCOUNTER — Other Ambulatory Visit: Payer: Self-pay | Admitting: Internal Medicine

## 2023-03-08 ENCOUNTER — Other Ambulatory Visit: Payer: Self-pay | Admitting: Family Medicine

## 2023-03-08 DIAGNOSIS — I2510 Atherosclerotic heart disease of native coronary artery without angina pectoris: Secondary | ICD-10-CM

## 2023-03-08 DIAGNOSIS — E1169 Type 2 diabetes mellitus with other specified complication: Secondary | ICD-10-CM

## 2023-03-08 DIAGNOSIS — E1159 Type 2 diabetes mellitus with other circulatory complications: Secondary | ICD-10-CM

## 2023-04-04 ENCOUNTER — Other Ambulatory Visit: Payer: Self-pay | Admitting: Internal Medicine

## 2023-04-12 ENCOUNTER — Other Ambulatory Visit: Payer: Self-pay | Admitting: Internal Medicine

## 2023-05-03 ENCOUNTER — Other Ambulatory Visit (HOSPITAL_COMMUNITY): Payer: Self-pay

## 2023-05-09 ENCOUNTER — Other Ambulatory Visit (HOSPITAL_COMMUNITY): Payer: Self-pay

## 2023-08-09 DIAGNOSIS — I252 Old myocardial infarction: Secondary | ICD-10-CM | POA: Insufficient documentation

## 2023-08-10 ENCOUNTER — Ambulatory Visit: Payer: BC Managed Care – PPO | Admitting: Family Medicine

## 2023-08-10 VITALS — BP 136/84 | HR 66 | Ht 71.25 in | Wt 246.2 lb

## 2023-08-10 DIAGNOSIS — E1159 Type 2 diabetes mellitus with other circulatory complications: Secondary | ICD-10-CM | POA: Diagnosis not present

## 2023-08-10 DIAGNOSIS — Z0000 Encounter for general adult medical examination without abnormal findings: Secondary | ICD-10-CM

## 2023-08-10 DIAGNOSIS — N5201 Erectile dysfunction due to arterial insufficiency: Secondary | ICD-10-CM

## 2023-08-10 DIAGNOSIS — K219 Gastro-esophageal reflux disease without esophagitis: Secondary | ICD-10-CM

## 2023-08-10 DIAGNOSIS — Z8042 Family history of malignant neoplasm of prostate: Secondary | ICD-10-CM

## 2023-08-10 DIAGNOSIS — I2510 Atherosclerotic heart disease of native coronary artery without angina pectoris: Secondary | ICD-10-CM

## 2023-08-10 DIAGNOSIS — J302 Other seasonal allergic rhinitis: Secondary | ICD-10-CM

## 2023-08-10 DIAGNOSIS — D126 Benign neoplasm of colon, unspecified: Secondary | ICD-10-CM

## 2023-08-10 DIAGNOSIS — Z23 Encounter for immunization: Secondary | ICD-10-CM | POA: Diagnosis not present

## 2023-08-10 DIAGNOSIS — Z8669 Personal history of other diseases of the nervous system and sense organs: Secondary | ICD-10-CM

## 2023-08-10 DIAGNOSIS — I152 Hypertension secondary to endocrine disorders: Secondary | ICD-10-CM

## 2023-08-10 DIAGNOSIS — H9113 Presbycusis, bilateral: Secondary | ICD-10-CM

## 2023-08-10 DIAGNOSIS — Z125 Encounter for screening for malignant neoplasm of prostate: Secondary | ICD-10-CM

## 2023-08-10 DIAGNOSIS — I252 Old myocardial infarction: Secondary | ICD-10-CM

## 2023-08-10 DIAGNOSIS — E1169 Type 2 diabetes mellitus with other specified complication: Secondary | ICD-10-CM

## 2023-08-10 LAB — POCT UA - MICROALBUMIN
Albumin/Creatinine Ratio, Urine, POC: <14.2
Creatinine, POC: 35.1 mg/dL
Microalbumin Ur, POC: <5 mg/L

## 2023-08-10 LAB — LIPID PANEL

## 2023-08-10 LAB — POCT GLYCOSYLATED HEMOGLOBIN (HGB A1C): Hemoglobin A1C: 8.7 % — AB (ref 4.0–5.6)

## 2023-08-10 MED ORDER — NITROGLYCERIN 0.4 MG SL SUBL: 0.4000 mg | SUBLINGUAL_TABLET | SUBLINGUAL | 3 refills | Status: AC | PRN

## 2023-08-10 MED ORDER — TIRZEPATIDE 2.5 MG/0.5ML ~~LOC~~ SOAJ: 2.5000 mg | SUBCUTANEOUS | Status: DC

## 2023-08-10 NOTE — Progress Notes (Signed)
Complete physical exam  Patient: Jonathan Lindsey   DOB: 09-17-1960   63 y.o. Male  MRN: [id number]  Subjective:    Chief Complaint  Patient presents with   Annual Exam    Fasting. Hx of prostate cancer.     Jonathan Lindsey is a 63 y.o. male who presents today for a complete physical exam. He reports consuming a general diet. Home exercise routine includes walking 2 hrs per week. He generally feels fairly well. He reports sleeping fairly well. He does not have additional problems to discuss today.  He did stop taking his Trulicity mainly because it was causing difficulty with constipation and since he stopped he is actually done fairly well.  He does have a family history of colon cancer as well as prostate cancer.  He will be following up getting a colonoscopy in the very near future.  He has not seen cardiology in over a year and I did recommend that he set up an appointment.  He is not having any chest pain, shortness of breath, PND or DOE.  He does see ophthalmology regularly due to this previous ocular problems.  He has very little difficulty with reflux and does use Pepcid on an as-needed basis.  He continues on Coreg and Altace.  He is also taking Crestor.  In the past Repatha was recommended however it was too expensive.  He does use relatively small doses of Cialis with good results.  He does wear hearing aids.   Most recent fall risk assessment:    08/10/2023    3:13 PM  Fall Risk   Falls in the past year? 0  Number falls in past yr: 0  Injury with Fall? 0     Most recent depression screenings:    08/10/2023    3:13 PM 04/26/2022    2:35 PM  PHQ 2/9 Scores  PHQ - 2 Score 0 0    Vision:Within last year and Dental: No current dental problems and Last dental visit: July 2024    Patient Care Team: Ronnald Nian, MD as PCP - General (Family Medicine) Pricilla Riffle, MD as PCP - Cardiology (Cardiology) Pricilla Riffle, MD as Consulting Physician (Cardiology)    Outpatient Medications Prior to Visit  Medication Sig   acetaminophen (TYLENOL) 325 MG tablet Take 325-650 mg by mouth every 6 (six) hours as needed (for pain or headaches).   carvedilol (COREG) 12.5 MG tablet TAKE 1 TABLET BY MOUTH TWICE A DAY WITH FOOD. PLEASE CALL [phone] TO SCHEDULE   [facility] ASPIRIN LOW DOSE 81 MG EC tablet TAKE 1 TABLET BY MOUTH EVERY DAY   dorzolamide-timolol (COSOPT) 22.3-6.8 MG/ML ophthalmic solution Place 1 drop into the left eye 2 (two) times daily.   famotidine (PEPCID) 10 MG tablet Take 1 tablet (10 mg total) by mouth 2 (two) times daily.   Multiple Vitamins-Minerals (EYE VITAMINS PO) Take 1 tablet by mouth daily.   Omega-3 Fatty Acids (FISH OIL) 1000 MG CAPS Take 1,000 mg by mouth daily.    ramipril (ALTACE) 2.5 MG capsule TAKE 1 CAPSULE BY MOUTH EVERY DAY   rosuvastatin (CRESTOR) 40 MG tablet TAKE 1 TABLET BY MOUTH EVERY DAY   tadalafil (CIALIS) 20 MG tablet Take 1 tablet (20 mg total) by mouth daily as needed for erectile dysfunction.   [DISCONTINUED] Dulaglutide (TRULICITY) 3 MG/0.5ML SOPN Inject 3 mg as directed once a week.   diphenhydrAMINE (BENADRYL) 25 mg capsule Take 25 mg by mouth every 6 (  six) hours as needed (for any potential allergic reactions). (Patient not taking: Reported on 08/10/2023)   glucose blood test strip Use as instructed to check blood sugar one time a day   OneTouch Delica Lancets 33G MISC 1 each by Does not apply route daily.   predniSONE (STERAPRED UNI-PAK 21 TAB) 10 MG (21) TBPK tablet 10mg  Tabs, 6 day taper. Use as directed (Patient not taking: Reported on 08/10/2023)   [DISCONTINUED] nitroGLYCERIN (NITROSTAT) 0.4 MG SL tablet Place 1 tablet (0.4 mg total) under the tongue every 5 (five) minutes x 3 doses as needed for chest pain. (Patient not taking: Reported on 08/10/2023)   No facility-administered medications prior to visit.    Review of Systems  All other systems reviewed and are negative.  Otherwise family and social  history as well as health maintenance and immunizations was reviewed.       Objective:    Physical Exam  Alert and in no distress. Tympanic membranes and canals are normal. Pharyngeal area is normal. Neck is supple without adenopathy or thyromegaly. Cardiac exam shows a regular sinus rhythm without murmurs or gallops. Lungs are clear to auscultation. Hemoglobin A1c is 8.7       Assessment & Plan:    Routine general medical examination at a health care facility - Plan: CBC with Differential/Platelet, Comprehensive metabolic panel, Lipid panel  Erectile dysfunction due to arterial insufficiency  Type 2 diabetes mellitus with other specified complication, without long-term current use of insulin (HCC) - Plan: CBC with Differential/Platelet, Comprehensive metabolic panel, Lipid panel, POCT glycosylated hemoglobin (Hb A1C), POCT UA - Microalbumin, tirzepatide (MOUNJARO) 2.5 MG/0.5ML Pen  Tubular adenoma of colon  Hypertension associated with diabetes (HCC) - Plan: CBC with Differential/Platelet, Comprehensive metabolic panel  Hyperlipidemia associated with type 2 diabetes mellitus (HCC) - Plan: Lipid panel  History of retinal detachment  Gastroesophageal reflux disease without esophagitis  Coronary artery disease involving native coronary artery of native heart without angina pectoris - Plan: nitroGLYCERIN (NITROSTAT) 0.4 MG SL tablet  Seasonal allergic rhinitis, unspecified trigger  History of ST elevation myocardial infarction (STEMI)  Need for pneumococcal 20-valent conjugate vaccination - Plan: Pneumococcal conjugate vaccine 20-valent (Prevnar 20)  CAD in native artery - Plan: nitroGLYCERIN (NITROSTAT) 0.4 MG SL tablet  Family history of prostate cancer  Screening for prostate cancer - Plan: PSA  Presbycusis of both ears  Immunization History  Administered Date(s) Administered   Influenza Inj Mdck Quad Pf 05/08/2017, 04/02/2018   Influenza Split 06/24/2016    Influenza, Seasonal, Injecte, Preservative Fre 04/15/2023   Influenza,inj,Quad PF,6+ Mos 03/26/2019, 05/22/2021, 04/26/2022   Influenza-Unspecified 05/08/2017, 04/02/2018, 05/10/2019, 04/21/2020   PFIZER Comirnaty(Gray Top)Covid-19 Tri-Sucrose Vaccine 10/31/2020, 04/26/2022   PFIZER(Purple Top)SARS-COV-2 Vaccination 09/22/2019, 10/07/2019, 04/21/2020   PNEUMOCOCCAL CONJUGATE-20 08/10/2023   Pfizer Covid-19 Vaccine Bivalent Booster 87yrs & up 05/22/2021   Pneumococcal Conjugate-13 06/07/2017   Pneumococcal Polysaccharide-23 11/08/2013   Tdap 09/14/2013, 02/28/2020   Zoster Recombinant(Shingrix) 03/26/2019, 07/02/2019    Health Maintenance  Topic Date Due   OPHTHALMOLOGY EXAM  04/07/2022   FOOT EXAM  06/15/2022   COVID-19 Vaccine (7 - 2024-25 season) 03/20/2023   Diabetic kidney evaluation - eGFR measurement  04/27/2023   HEMOGLOBIN A1C  02/07/2024   Diabetic kidney evaluation - Urine ACR  08/09/2024   DTaP/Tdap/Td (3 - Td or Tdap) 02/27/2030   Colonoscopy  03/03/2031   Pneumococcal Vaccine 9-69 Years old  Completed   INFLUENZA VACCINE  Completed   Hepatitis C Screening  Completed   HIV  Screening  Completed   Zoster Vaccines- Shingrix  Completed   HPV VACCINES  Aged Out   Fecal DNA (Cologuard)  Discontinued     Problem List Items Addressed This Visit     Allergic rhinitis due to allergen   Coronary artery disease involving native coronary artery of native heart without angina pectoris   Relevant Medications   nitroGLYCERIN (NITROSTAT) 0.4 MG SL tablet   Gastroesophageal reflux disease   History of retinal detachment   History of ST elevation myocardial infarction (STEMI)   Hyperlipidemia associated with type 2 diabetes mellitus (HCC)   Relevant Medications   nitroGLYCERIN (NITROSTAT) 0.4 MG SL tablet   tirzepatide (MOUNJARO) 2.5 MG/0.5ML Pen   Other Relevant Orders   Lipid panel   Hypertension associated with diabetes (HCC)   Relevant Medications   nitroGLYCERIN  (NITROSTAT) 0.4 MG SL tablet   tirzepatide (MOUNJARO) 2.5 MG/0.5ML Pen   Other Relevant Orders   CBC with Differential/Platelet   Comprehensive metabolic panel   Tubular adenoma of colon   Type 2 diabetes mellitus with other specified complication (HCC)   Relevant Medications   tirzepatide (MOUNJARO) 2.5 MG/0.5ML Pen   Other Relevant Orders   CBC with Differential/Platelet   Comprehensive metabolic panel   Lipid panel   POCT glycosylated hemoglobin (Hb A1C) (Completed)   POCT UA - Microalbumin (Completed)   Vasculogenic erectile dysfunction   Other Visit Diagnoses       Routine general medical examination at a health care facility    -  Primary   Relevant Orders   CBC with Differential/Platelet   Comprehensive metabolic panel   Lipid panel     Need for pneumococcal 20-valent conjugate vaccination       Relevant Orders   Pneumococcal conjugate vaccine 20-valent (Prevnar 20) (Completed)     CAD in native artery       Relevant Medications   nitroGLYCERIN (NITROSTAT) 0.4 MG SL tablet     Family history of prostate cancer         Screening for prostate cancer       Relevant Orders   PSA     Presbycusis of both ears         I will switch him to [facility].  Sample given.  Instructions on proper use of this particular medication and the potential benefits of using this in terms of diabetes, weight, cardiac benefits.  His immunizations were updated.  He is to call me in 1 month to let me know how he is doing on Mounjaro and I will increase the dosing on that.  He is scheduled to come back here in 4 months for follow-up.  He will be seen by gastroenterology later this year.  He is aware to not use nitroglycerin if he uses the Cialis. Return in about 4 months (around 12/08/2023).     Sharlot Gowda, MD

## 2023-08-10 NOTE — Patient Instructions (Signed)
Send me a message on MyChart in a month

## 2023-08-11 ENCOUNTER — Encounter: Payer: Self-pay | Admitting: Family Medicine

## 2023-08-11 LAB — CBC WITH DIFFERENTIAL/PLATELET
Basophils Absolute: 0.1 10*3/uL (ref 0.0–0.2)
Basos: 1 %
EOS (ABSOLUTE): 0.2 10*3/uL (ref 0.0–0.4)
Eos: 3 %
Hematocrit: 50.5 % (ref 37.5–51.0)
Hemoglobin: 17 g/dL (ref 13.0–17.7)
Immature Grans (Abs): 0 10*3/uL (ref 0.0–0.1)
Immature Granulocytes: 0 %
Lymphocytes Absolute: 2.6 10*3/uL (ref 0.7–3.1)
Lymphs: 36 %
MCH: 32.7 pg (ref 26.6–33.0)
MCHC: 33.7 g/dL (ref 31.5–35.7)
MCV: 97 fL (ref 79–97)
Monocytes Absolute: 0.4 10*3/uL (ref 0.1–0.9)
Monocytes: 5 %
Neutrophils Absolute: 3.9 10*3/uL (ref 1.4–7.0)
Neutrophils: 55 %
Platelets: 172 10*3/uL (ref 150–450)
RBC: 5.2 x10E6/uL (ref 4.14–5.80)
RDW: 11.7 % (ref 11.6–15.4)
WBC: 7.2 10*3/uL (ref 3.4–10.8)

## 2023-08-11 LAB — LIPID PANEL
Cholesterol, Total: 167 mg/dL (ref 100–199)
HDL: 29 mg/dL — ABNORMAL LOW (ref >39)
LDL CALC COMMENT:: 5.8 ratio — ABNORMAL HIGH (ref 0.0–5.0)
LDL Chol Calc (NIH): 86 mg/dL (ref 0–99)
Triglycerides: 315 mg/dL — ABNORMAL HIGH (ref 0–149)
VLDL Cholesterol Cal: 52 mg/dL — ABNORMAL HIGH (ref 5–40)

## 2023-08-11 LAB — COMPREHENSIVE METABOLIC PANEL
ALT: 35 IU/L (ref 0–44)
AST: 30 IU/L (ref 0–40)
Albumin: 4.6 g/dL (ref 3.9–4.9)
Alkaline Phosphatase: 75 IU/L (ref 44–121)
BUN/Creatinine Ratio: 15 (ref 10–24)
BUN: 15 mg/dL (ref 8–27)
Bilirubin Total: 0.7 mg/dL (ref 0.0–1.2)
CO2: 23 mmol/L (ref 20–29)
Calcium: 9.7 mg/dL (ref 8.6–10.2)
Chloride: 95 mmol/L — ABNORMAL LOW (ref 96–106)
Creatinine, Ser: 0.98 mg/dL (ref 0.76–1.27)
Globulin, Total: 2.4 g/dL (ref 1.5–4.5)
Glucose: 186 mg/dL — ABNORMAL HIGH (ref 70–99)
Potassium: 4.2 mmol/L (ref 3.5–5.2)
Sodium: 135 mmol/L (ref 134–144)
Total Protein: 7 g/dL (ref 6.0–8.5)
eGFR: 87 mL/min/{1.73_m2} (ref >59)

## 2023-08-11 LAB — PSA: Prostate Specific Ag, Serum: 3.1 ng/mL (ref 0.0–4.0)

## 2023-08-19 ENCOUNTER — Other Ambulatory Visit (INDEPENDENT_AMBULATORY_CARE_PROVIDER_SITE_OTHER): Payer: BC Managed Care – PPO

## 2023-08-19 ENCOUNTER — Telehealth (INDEPENDENT_AMBULATORY_CARE_PROVIDER_SITE_OTHER): Payer: BC Managed Care – PPO | Admitting: Medical

## 2023-08-19 VITALS — BP 163/84 | HR 98 | Temp 100.7°F | Wt 240.0 lb

## 2023-08-19 DIAGNOSIS — J029 Acute pharyngitis, unspecified: Secondary | ICD-10-CM

## 2023-08-19 DIAGNOSIS — R509 Fever, unspecified: Secondary | ICD-10-CM

## 2023-08-19 DIAGNOSIS — J988 Other specified respiratory disorders: Secondary | ICD-10-CM

## 2023-08-19 DIAGNOSIS — R051 Acute cough: Secondary | ICD-10-CM

## 2023-08-19 LAB — POCT INFLUENZA A/B
Influenza A, POC: POSITIVE — AB
Influenza B, POC: NEGATIVE

## 2023-08-19 LAB — POCT RAPID STREP A (OFFICE): Rapid Strep A Screen: NEGATIVE

## 2023-08-19 MED ORDER — OSELTAMIVIR PHOSPHATE 75 MG PO CAPS: 75.0000 mg | ORAL_CAPSULE | Freq: Two times a day (BID) | ORAL | 0 refills | Status: DC

## 2023-08-19 NOTE — Progress Notes (Signed)
 Pt was notified of results

## 2023-08-19 NOTE — Addendum Note (Signed)
Addended by: Jac Canavan on: 08/19/2023 03:35 PM   Modules accepted: Orders

## 2023-08-19 NOTE — Progress Notes (Signed)
Subjective:     Patient ID: Jonathan Lindsey, male   DOB: June 11, 1961, 63 y.o.   MRN: [id number]  This visit type was conducted due to national recommendations for restrictions regarding the COVID-19 Pandemic (e.g. social distancing) in an effort to limit this patient's exposure and mitigate transmission in our community.  Due to their co-morbid illnesses, this patient is at least at moderate risk for complications without adequate follow up.  This format is felt to be most appropriate for this patient at this time.    Documentation for virtual audio and video telecommunications through [place] encounter:  The patient was located at home. The provider was located in the office. The patient did consent to this visit and is aware of possible charges through their insurance for this visit.  The other persons participating in this telemedicine service were-wife Time spent on call was 20 minutes and in review of previous records 20 minutes total.  This virtual service is not related to other E/M service within previous 7 days.   HPI Chief Complaint  Patient presents with   cough    Cough, sore throat, achy, weakness, chills, no appetite. Symptoms started Wednesday afternoon. Taking dayquil and nquil   Virtual for illness.  He notes body aches, sore throat, running some low grade fever, sore throat.   Started 2 days ago.  Has had some body aches and chills, little appetite.  Cough is moderate.   At times may have to catch breath with cough fits.     No sinus pressure.  Some mild headaches.  Had some loose stools, but no nausea or vomiting.   Getting some productive cough.     Has been around some sick contacts.   Nonsmoker  Currently using dayquil and Nyquil.   Water intake is ok, could be better.  No other aggravating or relieving factors. No other complaint.   Past Medical History:  Diagnosis Date   CAD (coronary artery disease), native coronary artery    a. STEMI 10/2013 s/p  PTCA & DES to pLAD b. Cath 03/2018 showed patent stent    Eye abnormalities    left eye - no lens   Hypertension    Myocardial infarction [facility]) 2015   Neuromuscular disorder (HCC)    Current Outpatient Medications on File Prior to Visit  Medication Sig Dispense Refill   acetaminophen (TYLENOL) 325 MG tablet Take 325-650 mg by mouth every 6 (six) hours as needed (for pain or headaches).     carvedilol (COREG) 12.5 MG tablet TAKE 1 TABLET BY MOUTH TWICE A DAY WITH FOOD. PLEASE CALL [phone] TO SCHEDULE 30 tablet 0   [facility] ASPIRIN LOW DOSE 81 MG EC tablet TAKE 1 TABLET BY MOUTH EVERY DAY 30 tablet 0   diphenhydrAMINE (BENADRYL) 25 mg capsule Take 25 mg by mouth every 6 (six) hours as needed (for any potential allergic reactions).     dorzolamide-timolol (COSOPT) 22.3-6.8 MG/ML ophthalmic solution Place 1 drop into the left eye 2 (two) times daily.     famotidine (PEPCID) 10 MG tablet Take 1 tablet (10 mg total) by mouth 2 (two) times daily. 60 tablet 1   Multiple Vitamins-Minerals (EYE VITAMINS PO) Take 1 tablet by mouth daily.     Omega-3 Fatty Acids (FISH OIL) 1000 MG CAPS Take 1,000 mg by mouth daily.      ramipril (ALTACE) 2.5 MG capsule TAKE 1 CAPSULE BY MOUTH EVERY DAY 90 capsule 1   rosuvastatin (CRESTOR) 40 MG tablet TAKE 1  TABLET BY MOUTH EVERY DAY 90 tablet 1   tadalafil (CIALIS) 20 MG tablet Take 1 tablet (20 mg total) by mouth daily as needed for erectile dysfunction. 30 tablet 3   tirzepatide (MOUNJARO) 2.5 MG/0.5ML Pen Inject 2.5 mg into the skin once a week.     nitroGLYCERIN (NITROSTAT) 0.4 MG SL tablet Place 1 tablet (0.4 mg total) under the tongue every 5 (five) minutes x 3 doses as needed for chest pain. 25 tablet 3   No current facility-administered medications on file prior to visit.     Review of Systems As in subjective    Objective:   Physical Exam Due to coronavirus pandemic stay at home measures, patient visit was virtual and they were not examined in person.    BP (!) 163/84   Pulse 98   Temp (!) 100.7 F (38.2 C)   Wt 240 lb (108.9 kg)   BMI 33.24 kg/m  Gen: wd, wn nad Some ill-appearing, no labored breathing or wheezing      Assessment:     Encounter Diagnoses  Name Primary?   Sore throat Yes   Acute cough    Fever and chills    Respiratory tract infection        Plan:     Advised rest, hydration, salt water gargles, warm fluids to soothe the throat, can use Tylenol for fever not feeling well.  Advise he stay away from over-the-counter decongestants as to not run the blood pressure up.  It would be safer to use Mucinex DM or Coricidin HBP products for congestion and mucus.  He will come to our back parking lot here shortly for flu and strep testing given his more significant sore throat symptoms.  Addendum: Testing the back although showed strep negative but flu positive  Recommendations Hydrate well throughout the day with clear fluids over the next few days Use Tylenol for fever not feeling well, over-the-counter Tylenol every 4-6 hours as needed REST Begin Tamiflu to hopefully cut down on the severity of the symptoms and the timeframe of symptoms, this is twice daily for 5 days.  Prescription sent to your pharmacy If much worse over the next few days such as worse breathing, unable to keep anything down, extreme fatigue and dehydration, then go to the emergency department Symptoms can last a week or more but with Tamiflu hopefully this will cut down on how long you feel sick  Call or recheck if not much improved over the next 3 days   [name] "[name]" was seen today for cough.  Diagnoses and all orders for this visit:  Sore throat  Acute cough  Fever and chills  Respiratory tract infection  Other orders -     oseltamivir (TAMIFLU) 75 MG capsule; Take 1 capsule (75 mg total) by mouth 2 (two) times daily.    F/u prn

## 2023-08-31 ENCOUNTER — Other Ambulatory Visit: Payer: Self-pay | Admitting: Family Medicine

## 2023-08-31 ENCOUNTER — Other Ambulatory Visit: Payer: Self-pay | Admitting: Internal Medicine

## 2023-08-31 DIAGNOSIS — E1169 Type 2 diabetes mellitus with other specified complication: Secondary | ICD-10-CM

## 2023-08-31 MED ORDER — TIRZEPATIDE 2.5 MG/0.5ML ~~LOC~~ SOAJ
2.5000 mg | SUBCUTANEOUS | 1 refills | Status: DC
  Filled 2023-08-31: qty 2, 28d supply, fill #0

## 2023-08-31 MED ORDER — CARVEDILOL 12.5 MG PO TABS: ORAL_TABLET | ORAL | 0 refills | Status: DC

## 2023-09-01 ENCOUNTER — Other Ambulatory Visit (HOSPITAL_COMMUNITY): Payer: Self-pay

## 2023-09-06 ENCOUNTER — Other Ambulatory Visit: Payer: Self-pay | Admitting: Family Medicine

## 2023-09-06 DIAGNOSIS — E1169 Type 2 diabetes mellitus with other specified complication: Secondary | ICD-10-CM

## 2023-09-06 DIAGNOSIS — E1159 Type 2 diabetes mellitus with other circulatory complications: Secondary | ICD-10-CM

## 2023-09-06 DIAGNOSIS — I2510 Atherosclerotic heart disease of native coronary artery without angina pectoris: Secondary | ICD-10-CM

## 2023-09-07 ENCOUNTER — Other Ambulatory Visit: Payer: Self-pay | Admitting: Internal Medicine

## 2023-09-13 ENCOUNTER — Encounter: Payer: Self-pay | Admitting: Internal Medicine

## 2023-10-05 ENCOUNTER — Other Ambulatory Visit: Payer: Self-pay | Admitting: Internal Medicine

## 2023-10-07 ENCOUNTER — Other Ambulatory Visit: Payer: Self-pay | Admitting: Internal Medicine

## 2023-10-07 MED ORDER — CARVEDILOL 12.5 MG PO TABS: ORAL_TABLET | ORAL | 0 refills | Status: DC

## 2023-10-07 MED ORDER — CARVEDILOL 12.5 MG PO TABS
ORAL_TABLET | ORAL | 0 refills | Status: DC
Start: 2023-10-07 — End: 2024-01-23

## 2023-10-07 NOTE — Telephone Encounter (Signed)
 Pt is passed his 3rd attempt and is requesting a refill? Does Dr. Tenny Craw want to refill? Please advise

## 2023-10-07 NOTE — Telephone Encounter (Signed)
 Called pt to discuss. Aware I will send to Dr. Tenny Craw for approval.  Pt understands it unfortunately may be next week before hearing back from [place] on this matter. Will forward to nurse to see if available appt for pt within next couple months.

## 2023-10-31 ENCOUNTER — Other Ambulatory Visit: Payer: Self-pay | Admitting: Internal Medicine

## 2023-11-02 NOTE — Telephone Encounter (Signed)
 Dr. Avanell Bob Pt. Last OV was 02/17/22. Pt is passed his 3rd attempt. Does Dr. Avanell Bob want to refill? Please advise.

## 2023-12-22 ENCOUNTER — Encounter: Payer: Self-pay | Admitting: Family Medicine

## 2023-12-22 ENCOUNTER — Ambulatory Visit: Payer: BC Managed Care – PPO | Admitting: Family Medicine

## 2023-12-22 VITALS — BP 124/70 | HR 66 | Wt 239.0 lb

## 2023-12-22 DIAGNOSIS — I252 Old myocardial infarction: Secondary | ICD-10-CM

## 2023-12-22 DIAGNOSIS — E669 Obesity, unspecified: Secondary | ICD-10-CM | POA: Diagnosis not present

## 2023-12-22 DIAGNOSIS — E1159 Type 2 diabetes mellitus with other circulatory complications: Secondary | ICD-10-CM | POA: Diagnosis not present

## 2023-12-22 DIAGNOSIS — E785 Hyperlipidemia, unspecified: Secondary | ICD-10-CM

## 2023-12-22 DIAGNOSIS — I152 Hypertension secondary to endocrine disorders: Secondary | ICD-10-CM

## 2023-12-22 DIAGNOSIS — E1169 Type 2 diabetes mellitus with other specified complication: Secondary | ICD-10-CM | POA: Diagnosis not present

## 2023-12-22 LAB — POCT GLYCOSYLATED HEMOGLOBIN (HGB A1C): Hemoglobin A1C: 9.4 % — AB (ref 4.0–5.6)

## 2023-12-22 MED ORDER — EMPAGLIFLOZIN 10 MG PO TABS: 10.0000 mg | ORAL_TABLET | Freq: Every day | ORAL | 1 refills | Status: DC

## 2023-12-22 MED ORDER — METFORMIN HCL ER 500 MG PO TB24: 500.0000 mg | ORAL_TABLET | Freq: Every day | ORAL | 1 refills | Status: DC

## 2023-12-22 NOTE — Progress Notes (Signed)
 Subjective:    Patient ID: Jonathan Lindsey, male    DOB: 09-22-1960, 63 y.o.   MRN: [id number]  Jonathan Lindsey is a 63 y.o. male who presents for follow-up of Type 2 diabetes mellitus.  Home blood sugar records: does not check  Current symptoms/problems include none and have been unchanged. Daily foot checks: yeah  Any foot concerns: no How often blood sugars checked: does not check Exercise: The patient does not participate in regular exercise at present. Diet: no diet restrictions  He did take several doses of Mounjaro  but unfortunately it caused a lot of GI problems especially with diarrhea and he stopped it.  He continues on Coreg , Altace  Crestor .  Has no difficulty with them.  He has made no major changes in his diet.  He does have an appointment set up with cardiology in the near future. The following portions of the patient's history were reviewed and updated as appropriate: allergies, current medications, past medical history, past social history and problem list.  ROS as in subjective above.     Objective:     Physical Exam Alert and in no distress otherwise not examined.  Hemoglobin A1c is 9.4 Lab Review    Latest Ref Rng & Units 12/22/2023    8:41 AM 08/10/2023    4:21 PM 08/10/2023    4:10 PM 03/01/2023    9:28 AM 10/26/2022    9:18 AM  Diabetic Labs  HbA1c 4.0 - 5.6 % 9.4  8.7   7.2  6.9   Microalbumin mg/L  <5.0      Micro/Creat Ratio   <14.2      Chol 100 - 199 mg/dL   366     HDL >44 mg/dL   29     Calc LDL 0 - 99 mg/dL   86     Triglycerides 0 - 149 mg/dL   034     Creatinine 7.42 - 1.27 mg/dL   5.95         12/20/8754    8:08 AM 08/19/2023    1:55 PM 08/10/2023    3:15 PM 03/01/2023    9:11 AM 02/07/2023    1:30 AM  BP/Weight  Systolic BP 124 163 136 138 143  Diastolic BP 70 84 84 82 87  Wt. (Lbs) 239 240 246.2 244.8   BMI 33.1 kg/m2 33.24 kg/m2 34.1 kg/m2 35.13 kg/m2       Latest Ref Rng & Units 06/15/2021    3:15 PM 04/07/2021   12:00 AM   Foot/eye exam completion dates  Eye Exam No Retinopathy  No Retinopathy      Foot Form Completion  Done      This result is from an external source.    [name]  reports that he has never smoked. He has never used smokeless tobacco. He reports current alcohol use of about 4.0 standard drinks of alcohol per week. He reports that he does not use drugs.     Assessment & Plan:    Type 2 diabetes mellitus with other specified complication, without long-term current use of insulin  (HCC) - Plan: POCT glycosylated hemoglobin (Hb A1C), empagliflozin (JARDIANCE) 10 MG TABS tablet, metFORMIN (GLUCOPHAGE-XR) 500 MG 24 hr tablet, Amb Referral to Nutrition and Diabetic Education  Obesity (BMI 30-39.9)  Hypertension associated with diabetes (HCC)  Hyperlipidemia associated with type 2 diabetes mellitus (HCC)  History of ST elevation myocardial infarction (STEMI) I will switch him to metformin as well as Jardiance.  Since he  is getting ready to go on a trip I recommend he start 1 at a time starting with Jardiance to make sure he does not have any symptoms and then go to metformin.  Explained that the metformin might cause GI symptoms and if so, let me know via message through MyChart.  Will also send him to diabetes and nutrition.

## 2023-12-28 NOTE — Progress Notes (Signed)
 Cardiology Office Note:  .   Date:  01/10/2024  ID:  Jonathan Lindsey, DOB 03/13/1961, MRN [id number] PCP: Joyce Norleen BROCKS, MD  [facility] Providers Cardiologist:  Vina Gull, MD    History of Present Illness: .   Jonathan Lindsey is a 63 y.o. male  with known CAD s/p acute anterolateral STEMI 10/2013 with  100% occluded proximal LAD.  Pt underwent PTCA/DES to the LAD   In 2019 repeat cath for CP    This showed no significant obstructive CAD. The previously placed LAD stent was patent. LVEF and LVEDP both were normal. His CP was ruled noncardiac. Coreg  increased for PVCs. Started on Pepcid  for possible GI etiology of his pain.  Patient comes in for regular f/u. He denies chest pain, dyspnea, palpitations, edema, dizziness. He has some stress at work. He walks 30 minutes a couple days a week.   ROS:    Studies Reviewed: SABRA    EKG Interpretation Date/Time:  Tuesday January 10 2024 07:43:16 EDT Ventricular Rate:  68 PR Interval:  204 QRS Duration:  114 QT Interval:  412 QTC Calculation: 438 R Axis:   -46  Text Interpretation: Normal sinus rhythm Left axis deviation Minimal voltage criteria for LVH, may be normal variant ( Cornell product ) When compared with ECG of 26-May-2020 17:06, Vent. rate has decreased BY  36 BPM Confirmed by Parthenia Klinefelter [phone]) on 01/10/2024 8:03:35 AM    Prior CV Studies:   Diagnostic Left Heart Catheterization 03/28/18 Procedures    LEFT HEART CATH AND CORONARY ANGIOGRAPHY  Conclusion      Previously placed Prox LAD to Mid LAD stent (unknown type) is widely patent. The left ventricular systolic function is normal. LV end diastolic pressure is normal. The left ventricular ejection fraction is 50-55% by visual estimate.   1. No significant obstructive CAD 2. Patent stent in the LAD 3. Normal LV function 4. Normal LVEDP   Plan: medical management.    Recommend Aspirin  81mg  daily for moderate CAD.  Coronary Diagrams    Diagnostic Diagram              Risk Assessment/Calculations:             Physical Exam:   VS:  BP 127/78   Pulse 68   Ht 5' 11 (1.803 m)   Wt 236 lb 9.6 oz (107.3 kg)   SpO2 93%   BMI 33.00 kg/m    Wt Readings from Last 3 Encounters:  01/10/24 236 lb 9.6 oz (107.3 kg)  12/22/23 239 lb (108.4 kg)  08/19/23 240 lb (108.9 kg)    GEN: Obese, in no acute distress NECK: No JVD; No carotid bruits CARDIAC:  RRR, no murmurs, rubs, gallops RESPIRATORY:  Clear to auscultation without rales, wheezing or rhonchi  ABDOMEN: Soft, non-tender, non-distended EXTREMITIES:  No edema; No deformity   ASSESSMENT AND PLAN: .    Coronary artery disease.   CAD s/p acute anterolateral STEMI 10/2013 with  100% occluded proximal LAD.  Pt underwent PTCA/DES to the LAD   In 2019 repeat cath for CP    This showed no significant obstructive CAD. The previously placed LAD stent was patent. -.  HLD.  Patient continues on statin Crestor  40.  LDL 86 above goal of less than 55. Will add zetia 10 mg daily and repeat flp in 3 months.    HTN  Keep on current regimen.  Remains good.  Follow-up.    Diabetes.  A1C 9.4  12/2023 just started jardiance  & metformin , has appt with nutritionist  Obesity-working on diet. Increase exercise 150 min weekly          Dispo: f/u in 1 yr.  Signed, Olivia Pavy, PA-C

## 2024-01-10 ENCOUNTER — Ambulatory Visit: Attending: Physician Assistant | Admitting: Physician Assistant

## 2024-01-10 ENCOUNTER — Encounter: Payer: Self-pay | Admitting: Physician Assistant

## 2024-01-10 VITALS — BP 127/78 | HR 68 | Ht 71.0 in | Wt 236.6 lb

## 2024-01-10 DIAGNOSIS — E1169 Type 2 diabetes mellitus with other specified complication: Secondary | ICD-10-CM

## 2024-01-10 DIAGNOSIS — E785 Hyperlipidemia, unspecified: Secondary | ICD-10-CM

## 2024-01-10 DIAGNOSIS — I2510 Atherosclerotic heart disease of native coronary artery without angina pectoris: Secondary | ICD-10-CM

## 2024-01-10 DIAGNOSIS — I10 Essential (primary) hypertension: Secondary | ICD-10-CM

## 2024-01-10 MED ORDER — EZETIMIBE 10 MG PO TABS: 10.0000 mg | ORAL_TABLET | Freq: Every day | ORAL | 3 refills | Status: AC

## 2024-01-10 NOTE — Patient Instructions (Signed)
 Medication Instructions:  Your physician has recommended you make the following change in your medication:  START ZETIA 10 MG DAILY.   *If you need a refill on your cardiac medications before your next appointment, please call your pharmacy*  Lab Work: TO BE DONE IN 3 MONTHS: FASTING LIPIDS, LFTS If you have labs (blood work) drawn today and your tests are completely normal, you will receive your results only by: MyChart Message (if you have MyChart) OR A paper copy in the mail If you have any lab test that is abnormal or we need to change your treatment, we will call you to review the results.  Testing/Procedures: NONE ORDERED   Follow-Up: At [facility], you and your health needs are our priority.  As part of our continuing mission to provide you with exceptional heart care, our providers are all part of one team.  This team includes your primary Cardiologist (physician) and Advanced Practice Providers or APPs (Physician Assistants and Nurse Practitioners) who all work together to provide you with the care you need, when you need it.  Your next appointment:   1 year(s)  Provider:   Vina Gull, MD   We recommend signing up for the patient portal called MyChart.  Sign up information is provided on this After Visit Summary.  MyChart is used to connect with patients for Virtual Visits (Telemedicine).  Patients are able to view lab/test results, encounter notes, upcoming appointments, etc.  Non-urgent messages can be sent to your provider as well.   To learn more about what you can do with MyChart, go to [email or web address].   Other Instructions Exercising to Stay Healthy  To become healthy and stay healthy, it is recommended that you do moderate-intensity and vigorous-intensity exercise. You can tell that you are exercising at a moderate intensity if your heart starts beating faster and you start breathing faster but can still hold a conversation. You can tell that you are  exercising at a vigorous intensity if you are breathing much harder and faster and cannot hold a conversation while exercising. How can exercise benefit me? Exercising regularly is important. It has many health benefits, such as: Improving overall fitness, flexibility, and endurance. Increasing bone density. Helping with weight control. Decreasing body fat. Increasing muscle strength and endurance. Reducing stress and tension, anxiety, depression, or anger. Improving overall health. What guidelines should I follow while exercising? Before you start a new exercise program, talk with your health care provider. Do not exercise so much that you hurt yourself, feel dizzy, or get very short of breath. Wear comfortable clothes and wear shoes with good support. Drink plenty of water while you exercise to prevent dehydration or heat stroke. Work out until your breathing and your heartbeat get faster (moderate intensity). How often should I exercise? Choose an activity that you enjoy, and set realistic goals. Your health care provider can help you make an activity plan that is individually designed and works best for you. Exercise regularly as told by your health care provider. This may include: Doing strength training two times a week, such as: Lifting weights. Using resistance bands. Push-ups. Sit-ups. Yoga. Doing a certain intensity of exercise for a given amount of time. Choose from these options: A total of 150 minutes of moderate-intensity exercise every week. A total of 75 minutes of vigorous-intensity exercise every week. A mix of moderate-intensity and vigorous-intensity exercise every week. Children, pregnant women, people who have not exercised regularly, people who are overweight, and older adults may  need to talk with a health care provider about what activities are safe to perform. If you have a medical condition, be sure to talk with your health care provider before you start a new  exercise program. What are some exercise ideas? Moderate-intensity exercise ideas include: Walking 1 mile (1.6 km) in about 15 minutes. Biking. Hiking. Golfing. Dancing. Water aerobics. Vigorous-intensity exercise ideas include: Walking 4.5 miles (7.2 km) or more in about 1 hour. Jogging or running 5 miles (8 km) in about 1 hour. Biking 10 miles (16.1 km) or more in about 1 hour. Lap swimming. Roller-skating or in-line skating. Cross-country skiing. Vigorous competitive sports, such as football, basketball, and soccer. Jumping rope. Aerobic dancing. What are some everyday activities that can help me get exercise? Yard work, such as: Child psychotherapist. Raking and bagging leaves. Washing your car. Pushing a stroller. Shoveling snow. Gardening. Washing windows or floors. How can I be more active in my day-to-day activities? Use stairs instead of an elevator. Take a walk during your lunch break. If you drive, park your car farther away from your work or school. If you take public transportation, get off one stop early and walk the rest of the way. Stand up or walk around during all of your indoor phone calls. Get up, stretch, and walk around every 30 minutes throughout the day. Enjoy exercise with a friend. Support to continue exercising will help you keep a regular routine of activity. Where to find more information You can find more information about exercising to stay healthy from: U.S. Department of Health and Human Services: ThisPath.fi Centers for Disease Control and Prevention (CDC): FootballExhibition.com.br Summary Exercising regularly is important. It will improve your overall fitness, flexibility, and endurance. Regular exercise will also improve your overall health. It can help you control your weight, reduce stress, and improve your bone density. Do not exercise so much that you hurt yourself, feel dizzy, or get very short of breath. Before you start a new exercise program,  talk with your health care provider. This information is not intended to replace advice given to you by your health care provider. Make sure you discuss any questions you have with your health care provider. Document Revised: 10/31/2020 Document Reviewed: 10/31/2020 Elsevier Patient Education  2024 [employer].

## 2024-01-15 ENCOUNTER — Other Ambulatory Visit: Payer: Self-pay | Admitting: Family Medicine

## 2024-01-15 DIAGNOSIS — E1169 Type 2 diabetes mellitus with other specified complication: Secondary | ICD-10-CM

## 2024-01-21 ENCOUNTER — Other Ambulatory Visit: Payer: Self-pay | Admitting: Internal Medicine

## 2024-01-23 MED ORDER — CARVEDILOL 12.5 MG PO TABS: 12.5000 mg | ORAL_TABLET | Freq: Two times a day (BID) | ORAL | 3 refills | Status: AC

## 2024-02-08 ENCOUNTER — Encounter: Payer: Self-pay | Admitting: Skilled Nursing Facility1

## 2024-02-08 ENCOUNTER — Encounter: Attending: Family Medicine | Admitting: Skilled Nursing Facility1

## 2024-02-08 DIAGNOSIS — E1169 Type 2 diabetes mellitus with other specified complication: Secondary | ICD-10-CM | POA: Diagnosis present

## 2024-02-08 NOTE — Progress Notes (Unsigned)
 A1C 9.4  Other Dx: HTN MI  DM medications: Jardiance  Metformin    Pt states his main focus is weight loss.  Pt states he does not check his blood sugars often.  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 Pt states they keep their grandson in the evening on Thursday.  Pt states he is active because he has been helping his son cut up a felled tree in his sons yard the last few weekends. Pt states his blood sugars are under control.  Diabetes Self-Management Education  Visit Type: First/Initial  Appt. Start Time: 4:33 end time 5:16  02/09/2024  [name], identified by name and date of birth, is a 63 y.o. male with a diagnosis of Diabetes: Type 2.   ASSESSMENT  There were no vitals taken for this visit. There is no height or weight on file to calculate BMI.   Diabetes Self-Management Education - 02/08/24 1638       Visit Information   Visit Type First/Initial      Initial Visit   Diabetes Type Type 2    Are you currently following a meal plan? No    Are you taking your medications as prescribed? Yes      Health Coping   How would you rate your overall health? Good      Psychosocial Assessment   Patient Belief/Attitude about Diabetes Denial    What is the hardest part about your diabetes right now, causing you the most concern, or is the most worrisome to you about your diabetes?   Making healty food and beverage choices    Self-care barriers None      Pre-Education Assessment   Patient understands the diabetes disease and treatment process. Needs Instruction    Patient understands incorporating nutritional management into lifestyle. Needs Instruction    Patient undertands incorporating physical activity into lifestyle. Needs Instruction    Patient understands using medications safely. Needs Instruction    Patient understands monitoring blood glucose, interpreting and using results Needs Instruction     Patient understands prevention, detection, and treatment of acute complications. Needs Instruction    Patient understands prevention, detection, and treatment of chronic complications. Needs Instruction    Patient understands how to develop strategies to address psychosocial issues. Needs Instruction    Patient understands how to develop strategies to promote health/change behavior. Needs Instruction      Complications   Last HgB A1C per patient/outside source 9.4 %    How often do you check your blood sugar? 0 times/day (not testing)    Have you had a dilated eye exam in the past 12 months? Yes    Have you had a dental exam in the past 12 months? Yes    Are you checking your feet? Yes    How many days per week are you checking your feet? 7      Dietary Intake   Breakfast skipped    Lunch leftovers or chicken salad sandwich + chips or fruit    Snack (afternoon) trailmix    Dinner eaten out    Snack (evening) ice cream    Beverage(s) water, zero gatorade, 1 celcius      Activity / Exercise   Activity / Exercise Type ADL's      Patient Education   Previous Diabetes Education No    Disease Pathophysiology Definition of diabetes, type 1 and 2, and the diagnosis  of diabetes;Factors that contribute to the development of diabetes    Healthy Eating Plate Method;Food label reading, portion sizes and measuring food.;Role of diet in the treatment of diabetes and the relationship between the three main macronutrients and blood glucose level;Carbohydrate counting;Information on hints to eating out and maintain blood glucose control.    Being Active Role of exercise on diabetes management, blood pressure control and cardiac health.;Identified with patient nutritional and/or medication changes necessary with exercise.    Medications Reviewed patients medication for diabetes, action, purpose, timing of dose and side effects.    Monitoring Taught/evaluated SMBG meter.;Purpose and frequency of  SMBG.;Daily foot exams;Yearly dilated eye exam;Interpreting lab values - A1C, lipid, urine microalbumina.    Acute complications Taught prevention, symptoms, and  treatment of hypoglycemia - the 15 rule.;Discussed and identified patients' prevention, symptoms, and treatment of hyperglycemia.    Chronic complications Dental care;Retinopathy and reason for yearly dilated eye exams;Nephropathy, what it is, prevention of, the use of ACE, ARB's and early detection of through urine microalbumia.;Lipid levels, blood glucose control and heart disease;Assessed and discussed foot care and prevention of foot problems      Individualized Goals (developed by patient)   Nutrition Follow meal plan discussed;General guidelines for healthy choices and portions discussed    Physical Activity Exercise 5-7 days per week;30 minutes per day    Medications take my medication as prescribed    Monitoring  Test my blood glucose as discussed;Test blood glucose pre and post meals as discussed    Problem Solving Eating Pattern    Reducing Risk treat hypoglycemia with 15 grams of carbs if blood glucose less than 70mg /dL;do foot checks daily      Post-Education Assessment   Patient understands the diabetes disease and treatment process. Comprehends key points    Patient understands incorporating nutritional management into lifestyle. Comprehends key points    Patient undertands incorporating physical activity into lifestyle. Comprehends key points    Patient understands using medications safely. Comphrehends key points    Patient understands monitoring blood glucose, interpreting and using results Comprehends key points    Patient understands prevention, detection, and treatment of acute complications. Comprehends key points    Patient understands prevention, detection, and treatment of chronic complications. Comprehends key points    Patient understands how to develop strategies to address psychosocial issues. Comprehends key  points    Patient understands how to develop strategies to promote health/change behavior. Comprehends key points      Outcomes   Expected Outcomes Demonstrated limited interest in learning.  Expect minimal changes    Future DMSE PRN    Program Status Completed          Individualized Plan for Diabetes Self-Management Training:   Learning Objective:  Patient will have a greater understanding of diabetes self-management. Patient education plan is to attend individual and/or group sessions per assessed needs and concerns.    Expected Outcomes:  Demonstrated limited interest in learning.  Expect minimal changes  Education material provided: ADA - How to Thrive: A Guide for Your Journey with Diabetes, A1C conversion sheet, Meal plan card, and My Plate  If problems or questions, patient to contact team via:  Phone and Email  Future DSME appointment: PRN

## 2024-02-19 ENCOUNTER — Other Ambulatory Visit: Payer: Self-pay | Admitting: Family Medicine

## 2024-02-19 DIAGNOSIS — E1169 Type 2 diabetes mellitus with other specified complication: Secondary | ICD-10-CM

## 2024-03-04 ENCOUNTER — Other Ambulatory Visit: Payer: Self-pay | Admitting: Family Medicine

## 2024-03-04 DIAGNOSIS — E1159 Type 2 diabetes mellitus with other circulatory complications: Secondary | ICD-10-CM

## 2024-03-04 DIAGNOSIS — E1169 Type 2 diabetes mellitus with other specified complication: Secondary | ICD-10-CM

## 2024-03-04 DIAGNOSIS — I2510 Atherosclerotic heart disease of native coronary artery without angina pectoris: Secondary | ICD-10-CM

## 2024-04-15 ENCOUNTER — Other Ambulatory Visit: Payer: Self-pay | Admitting: Family Medicine

## 2024-04-15 DIAGNOSIS — E1169 Type 2 diabetes mellitus with other specified complication: Secondary | ICD-10-CM

## 2024-04-25 ENCOUNTER — Encounter: Payer: Self-pay | Admitting: Family Medicine

## 2024-04-25 ENCOUNTER — Ambulatory Visit (INDEPENDENT_AMBULATORY_CARE_PROVIDER_SITE_OTHER): Admitting: Family Medicine

## 2024-04-25 VITALS — BP 110/60 | HR 64 | Ht 71.0 in | Wt 231.6 lb

## 2024-04-25 DIAGNOSIS — Z23 Encounter for immunization: Secondary | ICD-10-CM

## 2024-04-25 DIAGNOSIS — E1159 Type 2 diabetes mellitus with other circulatory complications: Secondary | ICD-10-CM | POA: Diagnosis not present

## 2024-04-25 DIAGNOSIS — E1169 Type 2 diabetes mellitus with other specified complication: Secondary | ICD-10-CM | POA: Diagnosis not present

## 2024-04-25 DIAGNOSIS — I152 Hypertension secondary to endocrine disorders: Secondary | ICD-10-CM

## 2024-04-25 DIAGNOSIS — E785 Hyperlipidemia, unspecified: Secondary | ICD-10-CM

## 2024-04-25 DIAGNOSIS — Z8669 Personal history of other diseases of the nervous system and sense organs: Secondary | ICD-10-CM | POA: Diagnosis not present

## 2024-04-25 LAB — POCT GLYCOSYLATED HEMOGLOBIN (HGB A1C): Hemoglobin A1C: 7.5 % — AB (ref 4.0–5.6)

## 2024-04-25 LAB — LIPID PANEL
Chol/HDL Ratio: 3.8 ratio (ref 0.0–5.0)
Cholesterol, Total: 109 mg/dL (ref 100–199)
HDL: 29 mg/dL — ABNORMAL LOW (ref >39)
LDL Chol Calc (NIH): 50 mg/dL (ref 0–99)
Triglycerides: 181 mg/dL — ABNORMAL HIGH (ref 0–149)
VLDL Cholesterol Cal: 30 mg/dL (ref 5–40)

## 2024-04-25 MED ORDER — EMPAGLIFLOZIN 25 MG PO TABS: 25.0000 mg | ORAL_TABLET | Freq: Every day | ORAL | 3 refills | Status: AC

## 2024-04-25 NOTE — Progress Notes (Signed)
 Subjective:    Patient ID: Jonathan Lindsey, male    DOB: Jan 13, 1961, 63 y.o.   MRN: [id number]  Jonathan Lindsey is a 63 y.o. male who presents for follow-up of Type 2 diabetes mellitus.  Home blood sugar records: Does not check at home Current symptoms/problems include none and have been stable. Daily foot checks:   Any foot concerns: puts lotion on every am How often blood sugars checked: Exercise: walks Diet:regular,watches what he eats The following portions of the patient's history were reviewed and updated as appropriate: allergies, current medications, past medical history, past social history and problem list. Discussed the use of AI scribe software for clinical note transcription with the patient, who gave verbal consent to proceed. He is currently managing his diabetes with Jardiance  and metformin . Previously, he was on Mounjaro  and Trulicity , which were not well tolerated. His A1c improved from 8.7% in January to 7.5% recently, with average blood sugars around 170 mg/dL. He does not regularly check his blood sugars at home and has lost about 15 pounds since late last year.  He is on Zetia  and was started on Zetia   by his cardiologist in June. No chest pain is reported, and he is compliant with his blood pressure medication. He does not smoke or drink alcohol.  He has a history of retinal detachment and sees a retina specialist. He uses prescribed eye drops for pressure issues in his eye.  He has received his flu shot but declined the COVID vaccine. His immunizations for shingles, tetanus, and pneumonia are up to date.  He is planning a trip to visit family in [place]  . He works remotely and can work from any location.      ROS as in subjective above.     Objective:    Physical Exam Alert and in no distress otherwise not examined. Hemoglobin A1c is 7.5 Height 5' 11 (1.803 m), weight 231 lb 9.6 oz (105.1 kg).  Lab Review    Latest Ref Rng & Units 12/22/2023    8:41  AM 08/10/2023    4:21 PM 08/10/2023    4:10 PM 03/01/2023    9:28 AM 10/26/2022    9:18 AM  Diabetic Labs  HbA1c 4.0 - 5.6 % 9.4  8.7   7.2  6.9   Microalbumin mg/L  <5.0      Micro/Creat Ratio   <14.2      Chol 100 - 199 mg/dL   832     HDL >60 mg/dL   29     Calc LDL 0 - 99 mg/dL   86     Triglycerides 0 - 149 mg/dL   684     Creatinine 9.23 - 1.27 mg/dL   9.01         89/07/7972    8:08 AM 02/08/2024    4:33 PM 01/10/2024    7:46 AM 12/22/2023    8:08 AM 08/19/2023    1:55 PM  BP/Weight  Systolic BP   127 124 163  Diastolic BP   78 70 84  Wt. (Lbs) 231.6 -- 236.6 239 240  BMI 32.3 kg/m2  33 kg/m2 33.1 kg/m2 33.24 kg/m2      Latest Ref Rng & Units 06/15/2021    3:15 PM 04/07/2021   12:00 AM  Foot/eye exam completion dates  Eye Exam No Retinopathy  No Retinopathy      Foot Form Completion  Done      This result is from an external source.  Jonathan Lindsey  reports that he has never smoked. He has never used smokeless tobacco. He reports current alcohol use of about 4.0 standard drinks of alcohol per week. He reports that he does not use drugs.     Assessment & Plan:    Hyperlipidemia associated with type 2 diabetes mellitus (HCC)  Hypertension associated with diabetes (HCC)   Need for influenza vaccination    Type 2 diabetes mellitus with microvascular and macrovascular risk and circulatory complications Type 2 diabetes with improved A1c from 8.9 to 7.5. Current management with Jardiance  and metformin . Emphasized controlling macrovascular and microvascular complications. Discussed Jardiance 's cardiac benefits. - Increase Jardiance  to 25 mg daily. - Continue metformin  at current dose. - Perform blood work today. - Schedule follow-up in four months for blood work and foot exam. - Encourage periodic blood sugar monitoring. - Ensure refills for metformin  and Jardiance  are available.  General Health Maintenance Immunizations up to date. Discussed flu and COVID vaccinations'  importance. - Discuss COVID vaccination and encourage consideration.

## 2024-04-26 ENCOUNTER — Ambulatory Visit: Payer: Self-pay | Admitting: Family Medicine

## 2024-06-02 ENCOUNTER — Other Ambulatory Visit: Payer: Self-pay | Admitting: Family Medicine

## 2024-06-02 DIAGNOSIS — E1169 Type 2 diabetes mellitus with other specified complication: Secondary | ICD-10-CM

## 2024-08-28 ENCOUNTER — Ambulatory Visit: Payer: Self-pay | Admitting: Family Medicine

## 2024-09-18 ENCOUNTER — Encounter: Payer: BC Managed Care – PPO | Admitting: Family Medicine

## 2024-12-26 ENCOUNTER — Encounter: Payer: Self-pay | Admitting: Family Medicine
